# Patient Record
Sex: Male | Born: 1955 | Race: Black or African American | Hispanic: No | Marital: Married | Smoking: Former smoker
Health system: Southern US, Community
[De-identification: ages and names within clinical notes are randomized; demographics above are authoritative.]

## PROBLEM LIST (undated history)

## (undated) DIAGNOSIS — M542 Cervicalgia: Secondary | ICD-10-CM

## (undated) DIAGNOSIS — R9431 Abnormal electrocardiogram [ECG] [EKG]: Secondary | ICD-10-CM

## (undated) DIAGNOSIS — E119 Type 2 diabetes mellitus without complications: Secondary | ICD-10-CM

## (undated) DIAGNOSIS — G5601 Carpal tunnel syndrome, right upper limb: Secondary | ICD-10-CM

## (undated) DIAGNOSIS — R55 Syncope and collapse: Secondary | ICD-10-CM

## (undated) HISTORY — PX: CARPAL TUNNEL RELEASE: SHX101

---

## 1998-05-14 ENCOUNTER — Emergency Department (HOSPITAL_COMMUNITY): Admission: EM | Admit: 1998-05-14 | Discharge: 1998-05-14 | Payer: Self-pay | Admitting: Emergency Medicine

## 2006-07-26 ENCOUNTER — Inpatient Hospital Stay (HOSPITAL_COMMUNITY): Admission: EM | Admit: 2006-07-26 | Discharge: 2006-07-28 | Payer: Self-pay | Admitting: Emergency Medicine

## 2006-07-26 ENCOUNTER — Ambulatory Visit: Payer: Self-pay | Admitting: Cardiology

## 2006-07-27 ENCOUNTER — Encounter (INDEPENDENT_AMBULATORY_CARE_PROVIDER_SITE_OTHER): Payer: Self-pay | Admitting: *Deleted

## 2008-06-20 ENCOUNTER — Ambulatory Visit: Payer: Self-pay | Admitting: Internal Medicine

## 2008-07-04 ENCOUNTER — Encounter: Payer: Self-pay | Admitting: Internal Medicine

## 2008-07-04 ENCOUNTER — Ambulatory Visit: Payer: Self-pay | Admitting: Internal Medicine

## 2008-07-07 ENCOUNTER — Encounter: Payer: Self-pay | Admitting: Internal Medicine

## 2008-08-05 ENCOUNTER — Emergency Department (HOSPITAL_COMMUNITY): Admission: EM | Admit: 2008-08-05 | Discharge: 2008-08-05 | Payer: Self-pay | Admitting: Emergency Medicine

## 2010-08-19 LAB — DIFFERENTIAL
Basophils Absolute: 0.1 10*3/uL (ref 0.0–0.1)
Eosinophils Absolute: 0.3 10*3/uL (ref 0.0–0.7)
Eosinophils Relative: 5 % (ref 0–5)
Lymphs Abs: 1.7 10*3/uL (ref 0.7–4.0)
Neutro Abs: 2.7 10*3/uL (ref 1.7–7.7)

## 2010-08-19 LAB — CBC
HCT: 43 % (ref 39.0–52.0)
MCHC: 33.4 g/dL (ref 30.0–36.0)
MCV: 88.4 fL (ref 78.0–100.0)
RDW: 13.2 % (ref 11.5–15.5)

## 2010-08-19 LAB — BASIC METABOLIC PANEL
BUN: 5 mg/dL — ABNORMAL LOW (ref 6–23)
CO2: 26 mEq/L (ref 19–32)
Creatinine, Ser: 1.07 mg/dL (ref 0.4–1.5)
Glucose, Bld: 126 mg/dL — ABNORMAL HIGH (ref 70–99)

## 2010-09-24 NOTE — H&P (Signed)
NAME:  Joseph Todd, Joseph [name]:  [id number]   MEDICAL RECORD NO.:  [id number]          PATIENT TYPE:  EMS   LOCATION:  ED                           FACILITY:  [facility]   PHYSICIAN:  Kari Baars, M.D.  DATE OF BIRTH:  [birth year]   DATE OF ADMISSION:  07/26/2006  DATE OF DISCHARGE:                              HISTORY & PHYSICAL   PRIMARY CARE PHYSICIAN:  Gaspar Garbe, M.D.   CHIEF COMPLAINT:  Passed out.   HISTORY OF PRESENT ILLNESS:  Mr. Joseph Todd is a very pleasant 55 year old  African American male who has been remarkably healthy but presented to  the emergency department this evening with a complaint of two episodes  of syncope today.  He states that he was in his usual state of health  until this morning when he woke with a dry cough and sore throat.  Around 8:00 a.m. following a coughing paroxysm he  passed out.  This was  unwitnessed and he fell and hit the back of his head.  He awoke within 1  minute and returned to his normal state of health and went about his  normal business throughout the day.  At around 3:00 p.m. he had a  similar episode following another coughing fit, prompting him to come to  the emergency department.  While in the emergency department the patient  had a witnessed syncopal event that occurred when he was sitting up to  be placed on the cardiac monitor.  The nurse who witnessed the event  states that he was unconscious for less than 1 minute.  When he awoke he  was completely oriented.  There was no bladder or bowel incontinence and  he had no post episode confusion.  He was diaphoretic during the  episode.  The patient denies any prior history of syncope or seizure.  He has no prior cardiac history.  Denies any palpitations.  He has had a  significant decrease in the p.o. intake and states that he has not eaten  anything today.   In the emergency department he is found to have a temperature of 102.4,  heart rate initially 130  now 104.  Chest x-ray and head CT showed no  acute abnormalities.   REVIEW OF SYSTEMS:  All systems were reviewed with the patient and are  negative except as in HPI with following exceptions:  He does have some  myalgias.  Denies neck stiffness or headache except for where he hit the  back of his head.   PAST MEDICAL HISTORY:  No significant medical history or prior  surgeries.   MEDICATIONS:  None.   ALLERGIES:  NO KNOWN DRUG ALLERGIES.   SOCIAL HISTORY:  He is married with one son.  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  He quit smoking about 25 years ago.  He  drinks alcohol socially but has not had any recently and denies any  illicit drug use.   FAMILY HISTORY:  His mom may have had some heart disease later in life  but was otherwise healthy.  Father  was an alcoholic and may have also  had late onset heart disease.  His states he has a brother who has had  episodes of syncope recently as well.  No formal cause identified.   PHYSICAL EXAMINATION:  VITAL SIGNS:  Physical exam temperature 102.4,  blood pressure 136/94, pulse 130, respirations 18, oxygen saturation  100% on room air.  Orthostatic blood pressures obtained after fluid  hydration were 113/70 with a pulse of 106 lying, 120/80 with a pulse of  116 sitting, and 120/73 with pulse of 113 standing.  GENERAL:  Pleasant African American male in no acute distress.  HEENT: Oropharynx is moist.  Neck is supple.  Oropharynx moist.  No  tonsillar exudate.  TM's are clear bilaterally.  NECK:  Supple without lymphadenopathy, JVD or carotid bruits.  HEART:  Tachycardiac without murmurs, rubs or gallops.  LUNGS: Clear to auscultation bilaterally.  ABDOMEN:  Soft, nondistended, nontender with normoactive bowel sounds.  EXTREMITIES:  No clubbing, cyanosis or edema.  NEUROLOGIC:  Alert and oriented x4 with a nonfocal exam.   LABORATORY DATA:  CBC shows a white count of 7.3, hemoglobin 14.7,  platelets 425,000.   BMET significant for sodium 138, potassium 4.2,  chloride 102, bicarb 28, BUN 7, creatinine 1.4, glucose 127.  Cardiac  enzymes negative with a troponin of less than 0.05.  Influenza swab  negative.  Urinalysis negative.  Blood cultures were obtained and are  pending.   CLINICAL DATA:  1. EKG shows sinus tachycardia with nonspecific ST changes.  2. Chest x-ray shows no acute cardiopulmonary disease.  3. Head CT shows no acute intracranial abnormality.   ASSESSMENT/PLAN:  1. Recurrent syncopal episodes - these are likely vasovagal versus      orthostasis in the setting of coughing paroxysms and volume      depletion in the setting of his febrile illness.  Will admit to      telemetry to monitor for dysrhythmias.  Will rule out MI with      serial cardiac enzymes.  Will hydrate and monitor his status.      Consider an echocardiogram although he does not have any cardiac      murmur or preceding cardiac symptoms to suggest an underlying      cardiac cause.  No focal neurologic deficits to suggest a stroke.  2. Acute febrile illness - this is likely due to viral upper      respiratory infection versus acute bronchitis.  Will treat      empirically with Augmentin 875 mg b.i.d. and Mucinex DM while      awaiting blood culture data.  Monitor fever curve.  Tylenol p.r.n.  3. Disposition - anticipate discharge in 1-2 days if he is stable,      without need for further evaluation.      Kari Baars, M.D.  Electronically Signed     WS/MEDQ  D:  07/26/2006  T:  07/27/2006  Job:  [id number]   cc:   Gaspar Garbe, M.D.  Fax: [phone]

## 2010-09-24 NOTE — Discharge Summary (Signed)
NAMEVAUGHAN, Joseph [name]:  [id number]   MEDICAL RECORD NO.:  [id number]          PATIENT TYPE:  INP   LOCATION:  1409                         FACILITY:  [facility]   PHYSICIAN:  Gaspar Garbe, M.D.DATE OF BIRTH:  May 09, 1956   DATE OF ADMISSION:  07/26/2006  DATE OF DISCHARGE:  07/28/2006                               DISCHARGE SUMMARY   DISCHARGE DIAGNOSES:  1. Vasovagal syncope.  2. Possible hyperthyroidism, workup currently pending.  3. Upper respiratory infection/bronchitis.   DISCHARGE MEDICATIONS:  1. Augmentin 875 mg p.o. b.i.d. to complete a further 9 days.  2. Tessalon 200 mg p.o. t.i.d. p.r.n. cough.  3. Over-the-counter Coricidin or Claritin.  4. Ibuprofen and Tylenol as needed.  5. Atenolol 25 mg p.o. daily.   LABORATORY DATA AND X-RAY FINDINGS:  Sodium 140, potassium 4.4, BUN and  creatinine 8 and 1.3 respectively, glucose normal at 100.  White count  4.5, hemoglobin 15.5, hematocrit 39.6, platelets 338.  Cardiac enzymes  show elevated CK of 359, troponin 0.01 and MB fraction 0.6 indicating  cardiac issues.  TSH decreased at 0.19.  Labs currently pending are FT4,  T3 and antithyroid antibodies.  The patient had influenza for AFB stain  and was negative.  Urinalysis negative.  Blood cultures x2 negative.   Echocardiogram shows ejection fraction of 55-60% with no acute  abnormalities.   DISCHARGE PHYSICAL EXAMINATION:  VITAL SIGNS:  Blood pressure 131/82,  heart rate slightly increased at 101, respirations 18, temperature  ranging between 99 and 101, 98% on room air.  GENERAL:  In no acute distress.  HEENT:  Normocephalic, atraumatic.  PERRLA.  EOMI.  NECK:  No lymphadenopathy, JVD or bruit.  No thyromegaly.  No nodules  are appreciated.  LUNGS:  Coarse breath sounds with bronchitic changes.  No crackles or  wheezes at bases.  Slight tachycardia.  No murmurs, rubs or gallops  appreciated.  ABDOMEN:  Soft, nontender, normoactive bowel  sounds.  EXTREMITIES:  No clubbing, cyanosis or edema.   HOSPITAL COURSE:  The patient was admitted on July 26, 2006, after  having syncopal episodes at home and had been suffering an upper  respiratory infection for the last 2 days and actually lost  consciousness x2 after coughing.  He had a further one in the emergency  room, but was not on telemetry at the time.  It was observed by the ER  staff.  The patient was admitted.  EKG and cardiac enzymes were  undertaken as well as an echocardiogram and thyroid testing.  The  echocardiogram was normal as was the EKG showing tachycardia.  A  cardiology consult was undertaken with their diagnosis of vasovagal  syncope and no acute cardiac difficulties.  Recommendation was for  either beta-blocker or SSRI.  Later that same day, his thyroid test came  back abnormal with a TSH of 0.179 which is suppressed.  FT4 and T3,  antithyroid antibody were added on, but results are not available at the  time of this dictation.  The patient is to be initiated on the atenolol  25 mg per day.  He did have fevers overnight, but was relatively  comfortable.  He was able to take fluids on his own and able to take  antibiotics without any problems, therefore being discharge on July 28, 2006.  I had a long discussion with the patient and his wife regarding  thyroid problems and not to use over-the-counter stimulants and that  Coricidin or Claritin would be okay as well as ibuprofen and plenty of  fluids while his workup is pending.  The laboratory tests may take until  next week.  The patient and his wife were okay with this.  They also  know that the atenolol may help control his heart rate if, in fact, he  is hypothyroid.  They are to contact the office if they have any further  difficulties and indicate that his fever should break in the next day as  is the trend with people with similar illnesses in the community over  the past several weeks.  The patient  and his family will contact the  office if they have any further difficulties.   CONDITION ON DISCHARGE:  Stable.      Gaspar Garbe, M.D.  Electronically Signed     RWT/MEDQ  D:  07/28/2006  T:  07/28/2006  Job:  [id number]   cc:   Jonelle Sidle, MD  [address]  [place], [place] [address]

## 2010-09-24 NOTE — Consult Note (Signed)
NAMEMALIKHI, OGAN NO.:  [id number]   MEDICAL RECORD NO.:  [id number]          PATIENT TYPE:  INP   LOCATION:  1409                         FACILITY:  [facility]   PHYSICIAN:  Jonelle Sidle, MD DATE OF BIRTH:  1955/05/17   DATE OF CONSULTATION:  07/27/2006  DATE OF DISCHARGE:                                 CONSULTATION   REQUESTING PHYSICIAN:  Gaspar Garbe, M.D.   REASON FOR CONSULTATION:  Syncope.   HISTORY OF PRESENT ILLNESS:  Mr. Roser is a 55 year old male with no  reported major medical conditions who is now admitted to the hospital  with apparent bronchitis associated with paroxysms of cough, dry sore  throat, and fevers.  He has experienced recent episodes of syncope  associated with paroxysms of cough, the first of which was unwitnessed  and apparently caused him to fall back and hit his head.  The second  episode occurred reportedly in the emergency department while he was  seated prior to being placed on a cardiac monitor.  He was apparently  unconscious for less than a minute and awoke oriented without any  preceding or following symptoms of chest pain, palpitations, or dyspnea.  He was noted to be diaphoretic but did not have any seizure-like  activities.  He has had a significant decrease in his oral intake  recently as well, and was noted to have a temperature of 102.4 degrees  with heart rates initially up to 130, settling in just over 100 beats  per minute.  His electrocardiogram from March 19 showed sinus rhythm  with borderline increased voltage and nonspecific ST-T wave changes.  Repeat tracing today shows similar changes except for more prominent T-  wave inversions in the inferior leads.  His cardiac markers have been  normal with a troponin I level of 0.01, CK-MB of 1.1, and total CK of  385.  Chest x-ray reported some streaky atelectasis without acute  infiltrate or effusions.  He underwent an echocardiogram earlier today  reporting a left ventricular ejection fraction of 55-60% with no  regional wall motion abnormalities and evidence of diastolic  dysfunction.  Review of telemetry so far indicates no dysrhythmias.   ALLERGIES:  No known drug allergies.   CHRONIC MEDICATIONS:  None.  In the hospital he has been treated with:  1. Lovenox 40 mg subcu daily.  2. Aspirin 325 mg p.o. daily.  3. Mucinex DM one p.o. b.i.d.  4. Tylenol 650 mg p.o. q.6h. p.r.n.  5. In addition, he is taking Augmentin 875 mg p.o. b.i.d.  6. Tessalon Perles 200 mg p.o. p.r.n.   PAST MEDICAL HISTORY:  Is as outlined above.  The patient denies any  prior surgeries or cardiac problems.   SOCIAL HISTORY:  The patient is married.  He has one son.  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  He has a prior history of tobacco use  but quit 25 years ago.  He drinks alcohol socially.  Denies any illicit  substance use.   FAMILY HISTORY:  Significant for heart disease in the patient's mother  late in life.  Father with history of alcoholism.  Reports of brother  with history of syncope.  No clear history of sudden cardiac death or  premature cardiovascular disease.   REVIEW OF SYSTEMS:  As described in the history of present illness.  Has  had some headache and myalgias as well.  He also tells me that he has  had fainting episodes in the past with blood draws since childhood age.   EXAMINATION:  VITAL SIGNS:  Temperature is 99.8 degrees, heart rate 104  in sinus rhythm, respirations 16, blood pressure is 120/74.  GENERAL:  The patient is comfortable in no acute distress, still with  cough and sounding congested.  HEENT:  Conjunctivae and lids normal, oropharynx clear.  NECK:  Supple without elevated jugular venous pressure or loud bruits.  No thyromegaly is noted.  LUNGS:  Exhibit somewhat rhonchorous, coarse breath sounds.  No rales or  wheezing.  CARDIAC:  Reveals a regular rate and rhythm.  No S3 or S4 gallop.  No  loud  murmur or pericardial rub.  ABDOMEN:  Soft, nontender, normal active bowel sounds.  EXTREMITIES:  Exhibit no significant pitting edema.  Distal pulses are  2+.  SKIN:  Warm and dry.  MUSCULOSKELETAL:  No kyphosis is noted.  NEUROPSYCHIATRIC:  The patient is alert and oriented x3.  Affect is  normal.   LABORATORY DATA:  Wbc's 5.5, hemoglobin 13.6, hematocrit 39.6, platelets  372.  Sodium 140, potassium 4.2, chloride 107, bicarb 28, glucose 112,  BUN 6, creatinine 1.6.  TSH 0.179.   IMPRESSION:  1. Recent episodes of post tussive syncope, likely due to a      neurocardiogenic mechanism, particularly with a patient-reported      history of previous syncope following blood draws since childhood      age.  No major cardiac structural abnormalities identified by      echocardiogram today beyond diastolic dysfunction.  His      electrocardiogram shows nonspecific changes with some inferior T-      wave inversions which are somewhat more prominent based on serial      tracings, although associated with negative cardiac markers and no      recent chest pain.  His telemetry monitoring is unremarkable at      this point.  At this point ischemia or pulmonary embolus seem less      likely an etiology.  2. Apparent upper respiratory tract infection associated with fever,      myalgias, cough, and chest congestion.  No active infiltrates on      chest x-ray.  He is being treated with Augmentin.  3. Low TSH, no history of thyroid disease based on available      information.   RECOMMENDATIONS:  At this point agree with gentle hydration as  intravascular volume depletion can certainly exacerbate neurocardiogenic  syncope, and in this case post tussive syncope.  Would continue to  follow telemetry while hospitalized and follow up full set of cardiac  markers.  Mr. Trueheart has not reported any major episodes of syncope for some time and it is not clear that any additional electrophysiology   evaluation of this is necessary at this point.  Therapy with beta  blockers or sometimes selective serotonin reuptake inhibitors can be  used in patients with recurrent neurocardiogenic syncope, although in  this case it seems that management of his upper respiratory tract  infection and suppression of his cough might be the first choice.  Certainly, if he has recurrent problems then we could have him evaluated  by our electrophysiology team.  Will follow with you.      Jonelle Sidle, MD  Electronically Signed     SGM/MEDQ  D:  07/27/2006  T:  07/27/2006  Job:  [id number]

## 2013-06-21 ENCOUNTER — Encounter: Payer: Self-pay | Admitting: Internal Medicine

## 2013-11-21 ENCOUNTER — Encounter: Payer: Self-pay | Admitting: Internal Medicine

## 2014-11-19 ENCOUNTER — Encounter: Payer: Self-pay | Admitting: Internal Medicine

## 2019-06-09 ENCOUNTER — Other Ambulatory Visit: Payer: Self-pay

## 2019-06-09 ENCOUNTER — Observation Stay (HOSPITAL_COMMUNITY)
Admission: EM | Admit: 2019-06-09 | Discharge: 2019-06-10 | Disposition: A | Discharge status: Home / Self Care | Payer: Non-veteran care | Attending: Student in an Organized Health Care Education/Training Program | Admitting: Student in an Organized Health Care Education/Training Program

## 2019-06-09 ENCOUNTER — Emergency Department (HOSPITAL_COMMUNITY): Payer: Non-veteran care

## 2019-06-09 ENCOUNTER — Encounter (HOSPITAL_COMMUNITY): Payer: Self-pay

## 2019-06-09 ENCOUNTER — Observation Stay (HOSPITAL_BASED_OUTPATIENT_CLINIC_OR_DEPARTMENT_OTHER): Payer: Non-veteran care

## 2019-06-09 DIAGNOSIS — Z20822 Contact with and (suspected) exposure to covid-19: Secondary | ICD-10-CM | POA: Insufficient documentation

## 2019-06-09 DIAGNOSIS — R55 Syncope and collapse: Principal | ICD-10-CM | POA: Insufficient documentation

## 2019-06-09 DIAGNOSIS — M50322 Other cervical disc degeneration at C5-C6 level: Secondary | ICD-10-CM | POA: Insufficient documentation

## 2019-06-09 DIAGNOSIS — J3489 Other specified disorders of nose and nasal sinuses: Secondary | ICD-10-CM | POA: Diagnosis not present

## 2019-06-09 DIAGNOSIS — Z7984 Long term (current) use of oral hypoglycemic drugs: Secondary | ICD-10-CM | POA: Diagnosis not present

## 2019-06-09 DIAGNOSIS — I319 Disease of pericardium, unspecified: Secondary | ICD-10-CM | POA: Diagnosis not present

## 2019-06-09 DIAGNOSIS — E119 Type 2 diabetes mellitus without complications: Secondary | ICD-10-CM | POA: Diagnosis not present

## 2019-06-09 DIAGNOSIS — E118 Type 2 diabetes mellitus with unspecified complications: Secondary | ICD-10-CM

## 2019-06-09 DIAGNOSIS — R9431 Abnormal electrocardiogram [ECG] [EKG]: Secondary | ICD-10-CM

## 2019-06-09 DIAGNOSIS — N179 Acute kidney failure, unspecified: Secondary | ICD-10-CM | POA: Diagnosis not present

## 2019-06-09 DIAGNOSIS — E871 Hypo-osmolality and hyponatremia: Secondary | ICD-10-CM | POA: Diagnosis not present

## 2019-06-09 DIAGNOSIS — R0989 Other specified symptoms and signs involving the circulatory and respiratory systems: Secondary | ICD-10-CM | POA: Diagnosis not present

## 2019-06-09 DIAGNOSIS — G5601 Carpal tunnel syndrome, right upper limb: Secondary | ICD-10-CM

## 2019-06-09 DIAGNOSIS — R000 Tachycardia, unspecified: Secondary | ICD-10-CM | POA: Diagnosis not present

## 2019-06-09 DIAGNOSIS — M542 Cervicalgia: Secondary | ICD-10-CM | POA: Diagnosis not present

## 2019-06-09 LAB — CBG MONITORING, ED
Glucose-Capillary: 239 mg/dL — ABNORMAL HIGH (ref 70–99)
Glucose-Capillary: 234 mg/dL — ABNORMAL HIGH (ref 70–99)
Glucose-Capillary: 220 mg/dL — ABNORMAL HIGH (ref 70–99)
Glucose-Capillary: 169 mg/dL — ABNORMAL HIGH (ref 70–99)

## 2019-06-09 LAB — CBC
HCT: 51.2 % (ref 39.0–52.0)
Hemoglobin: 16.5 g/dL (ref 13.0–17.0)
MCH: 29.3 pg (ref 26.0–34.0)
MCHC: 32.2 g/dL (ref 30.0–36.0)
MCV: 90.9 fL (ref 80.0–100.0)
Platelets: 211 10*3/uL (ref 150–400)
RBC: 5.63 MIL/uL (ref 4.22–5.81)
RDW: 12.5 % (ref 11.5–15.5)
WBC: 6.4 10*3/uL (ref 4.0–10.5)
nRBC: 0 % (ref 0.0–0.2)

## 2019-06-09 LAB — HEMOGLOBIN A1C
Hgb A1c MFr Bld: 11.9 % — ABNORMAL HIGH (ref 4.8–5.6)
Mean Plasma Glucose: 294.83 mg/dL

## 2019-06-09 LAB — RESPIRATORY PANEL BY RT PCR (FLU A&B, COVID)
Influenza A by PCR: NEGATIVE
Influenza B by PCR: NEGATIVE
SARS Coronavirus 2 by RT PCR: NEGATIVE

## 2019-06-09 LAB — HIV ANTIBODY (ROUTINE TESTING W REFLEX): HIV Screen 4th Generation wRfx: NONREACTIVE

## 2019-06-09 LAB — BASIC METABOLIC PANEL
Anion gap: 11 (ref 5–15)
BUN: 17 mg/dL (ref 8–23)
CO2: 25 mmol/L (ref 22–32)
Calcium: 8.5 mg/dL — ABNORMAL LOW (ref 8.9–10.3)
Chloride: 94 mmol/L — ABNORMAL LOW (ref 98–111)
Creatinine, Ser: 1.42 mg/dL — ABNORMAL HIGH (ref 0.61–1.24)
GFR calc Af Amer: >60 mL/min (ref >60)
GFR calc non Af Amer: 53 mL/min — ABNORMAL LOW (ref >60)
Glucose, Bld: 290 mg/dL — ABNORMAL HIGH (ref 70–99)
Potassium: 4.1 mmol/L (ref 3.5–5.1)
Sodium: 130 mmol/L — ABNORMAL LOW (ref 135–145)

## 2019-06-09 LAB — ECHOCARDIOGRAM COMPLETE
Height: 68 in
Weight: 3056 oz

## 2019-06-09 LAB — GLUCOSE, CAPILLARY: Glucose-Capillary: 206 mg/dL — ABNORMAL HIGH (ref 70–99)

## 2019-06-09 LAB — TROPONIN I (HIGH SENSITIVITY)
Troponin I (High Sensitivity): 6 ng/L (ref <18)
Troponin I (High Sensitivity): 4 ng/L (ref <18)

## 2019-06-09 LAB — SODIUM, URINE, RANDOM: Sodium, Ur: 27 mmol/L

## 2019-06-09 LAB — OSMOLALITY: Osmolality: 287 mOsm/kg (ref 275–295)

## 2019-06-09 MED ORDER — ACETAMINOPHEN 325 MG PO TABS
650.0000 mg | ORAL_TABLET | Freq: Four times a day (QID) | ORAL | Status: AC
  Administered 2019-06-09 – 2019-06-10 (×3): 650 mg via ORAL
  Filled 2019-06-09 (×3): qty 2

## 2019-06-09 MED ORDER — ACETAMINOPHEN 325 MG PO TABS: 650.0000 mg | ORAL_TABLET | Freq: Four times a day (QID) | ORAL | Status: DC | PRN

## 2019-06-09 MED ORDER — SENNOSIDES-DOCUSATE SODIUM 8.6-50 MG PO TABS: 1.0000 | ORAL_TABLET | Freq: Every evening | ORAL | Status: DC | PRN

## 2019-06-09 MED ORDER — SODIUM CHLORIDE 0.9% FLUSH: 3.0000 mL | Freq: Once | INTRAVENOUS | Status: DC

## 2019-06-09 MED ORDER — ACETAMINOPHEN 650 MG RE SUPP: 650.0000 mg | Freq: Four times a day (QID) | RECTAL | Status: DC | PRN

## 2019-06-09 MED ORDER — INSULIN ASPART 100 UNIT/ML ~~LOC~~ SOLN
0.0000 [IU] | Freq: Three times a day (TID) | SUBCUTANEOUS | Status: DC
  Administered 2019-06-09 (×2): 5 [IU] via SUBCUTANEOUS
  Administered 2019-06-09 – 2019-06-10 (×2): 3 [IU] via SUBCUTANEOUS

## 2019-06-09 MED ORDER — PROMETHAZINE HCL 25 MG PO TABS: 12.5000 mg | ORAL_TABLET | Freq: Four times a day (QID) | ORAL | Status: DC | PRN

## 2019-06-09 MED ORDER — ENOXAPARIN SODIUM 40 MG/0.4ML ~~LOC~~ SOLN
40.0000 mg | Freq: Every day | SUBCUTANEOUS | Status: DC
  Filled 2019-06-09: qty 0.4

## 2019-06-09 NOTE — ED Notes (Signed)
Pt agrees to stay

## 2019-06-09 NOTE — ED Notes (Signed)
Internal Med at bedside.

## 2019-06-09 NOTE — H&P (Signed)
Date: 06/09/2019               Patient Name:  Joseph Todd MRN: [id number]  DOB: 1955-12-10 Age / Sex: 65 y.o., male   PCP: Patient, No Pcp Per         Medical Service: Internal Medicine Teaching Service         Attending Physician: Dr. Oswaldo Done, Marquita Palms, *    First Contact: Dr. Huel Cote Pager: [phone]  Second Contact: Dr. Delma Officer Pager: [phone]       After Hours (After 5p/  First Contact Pager: [phone]  weekends / holidays): Second Contact Pager: [phone]   Chief Complaint: Syncope   History of Present Illness:   Mr. Joseph Todd is a 64 y/o male with a PMHx of T2DM, right arm carpal tunnel who presents to the ED with c/o syncope.   Mr. Joseph Todd states that 06/08/19, he woke up feeling at his baseline. He was using the restroom and having a BM, when he suddenly developed sweating, felt that the room was spinning around him, and next was found down on the floor. His wife was home and heard him fall, so he does not believe he was down for long. Mr. Joseph Todd reports he had a prior episode like this approximately 4 months ago and was told that it was vasovagal. Afterwards, he laid down in bed to relax but continued to have intermittent dizzy episodes, and this is what prompted him to seek emergency medical care. He denies any fever, chills, SOB, dyspnea, CP, N/V, abdominal pain, headache. He denies any recent sick contacts.  Since yesterday's syncopal episode, he has been having pain at the base of his neck with radiation to both his arms. He also states that after he came to, he could not move his arms for 20 minutes but could move his legs without difficulty. He notes a hx of neck pain with some temperature sensitivity and right arm pain with future EMG planned at the [place]. Yesterday, he was unloading roof shingles and so he felt pretty sore in the AM but endorses drinking plenty of fluids.   Mr. Joseph Todd also notes polyuria (gets up 4 times nightly) but denies any hematuria or dysuria.  He has been tolerating PO intake without difficulty. He reports a doctor 1 year ago mentioned he had kidney disease but the doctor retired and no one else has mentioned it.   ED Course:  Patient presented to [facility] long after episode of syncope.  Initial lab work showed elevated creatinine of 1.42, mild hyponatremia at 130, negative CBC, negative troponins,  Meds:  Current Meds  Medication Sig  . metFORMIN (GLUCOPHAGE) 500 MG tablet Take 500 mg by mouth daily with breakfast.  . thiamine 100 MG tablet Take 100 mg by mouth 2 (two) times daily.  . Vitamin D, Ergocalciferol, (DRISDOL) 1.25 MG (50000 UNIT) CAPS capsule Take 50,000 Units by mouth once a week.   Allergies: Allergies as of 06/09/2019  . (No Known Allergies)   Past Medical History: - T2DM  - Carpal Tunnel (R) - Neck pain   Family History:  Diabetes- two brothers  htn-father  Heart disease-father   Social History:  Lives at home with wife  Denies smoking, drug use Drinks beer few times weekly. 3 beers in one setting  Review of Systems: A complete ROS was negative except as per HPI.   Physical Exam: Blood pressure 106/60, pulse (!) 103, temperature 98.1 F (36.7 C), temperature source Oral, resp.  rate (!) 30, height 5\' 8"  (1.727 m), weight 86.6 kg, SpO2 94 %.  Physical Exam Vitals and nursing note reviewed.  Constitutional:      General: He is not in acute distress.    Appearance: He is normal weight.  Cardiovascular:     Rate and Rhythm: Regular rhythm. Tachycardia present.     Heart sounds: No murmur.  Pulmonary:     Effort: Pulmonary effort is normal. No respiratory distress.     Breath sounds: Rales (bibasilar) present. No wheezing.  Abdominal:     General: Bowel sounds are normal. There is no distension.     Palpations: Abdomen is soft.     Tenderness: There is no abdominal tenderness.  Musculoskeletal:        General: No tenderness or deformity.     Right lower leg: No edema.     Left lower leg: No  edema.  Skin:    General: Skin is warm and dry.  Neurological:     General: No focal deficit present.     Mental Status: He is alert and oriented to person, place, and time. Mental status is at baseline.  Psychiatric:        Mood and Affect: Mood normal.        Behavior: Behavior normal.    Assessment & Plan by Problem: Active Problems:   Syncope  # Syncope  Likely a vasovagal syncopal episode at this time. Episode occurred after a BM with prodromal dizziness and diaphoresis. CT head without acute intracranial process. TTE was obtained and did not show any structural abnormalities. Initial glucose in ED was 239. Orthostatics were negative.   - Cardiology following, we appreciate their recommendations  - Telemetry   # Pericarditis  EKG changes on admission consistent with pericarditis, including diffuse ST elevation and PR depression. No recent sick contacts or URI symptoms consistent with viral infection. TTE was obtained and shows no evidence of pericardial effusion. Fortunately, patient remains asymptomatic, as with his AKI, we would not be able to treat with usual NSAIDs.   - Cardiology following, we appreciate their recommendations  - Telemetry  # Neck pain  Syncopal episode followed by acute pain in neck with radiation to bilateral arms. CT cervical spine without any acute cervical spine fracture, has degenerative disc disease. No acute neurological findings. Will continue to monitor.   - Tylenol 650 mg q6h for 3 doses, then PRN  # Acute Kidney Injury  # Hyponatremia  Per patient, no recent history of decreased oral intake and appears euvolemic on examination. He is tachycardic, which may represent mild dehydration.   - Urinalysis pending  - Serum osmolality - Urine sodium - BMP in the AM  # Type 2 Diabetes Mellitus  A1c today is 11.9%, demonstrating fairly uncontrolled diabetes.  His only home medication is Metformin and likely patient will need additional medications,  but will recommend outpatient follow-up.  - SSI  Code: FULL Diet: Carb modified DVT ppx: Lovenox   Dispo: Admit patient to Observation with expected length of stay less than 2 midnights.  Signed: Dr. Jose Persia Internal Medicine PGY-1  Pager: [phone] 06/09/2019, 4:50 PM

## 2019-06-09 NOTE — ED Notes (Signed)
[facility] here for transport to [facility] ED.

## 2019-06-09 NOTE — ED Notes (Signed)
[phone] daughter  Please call for an update

## 2019-06-09 NOTE — ED Provider Notes (Signed)
[facility] DEPT Provider Note   CSN: [id number] Arrival date & time: 06/09/19  0404     History Chief Complaint  Patient presents with  . Loss of Consciousness    Joseph Todd is a 64 y.o. male.  HPI     64 year old male with history of diabetes comes in with chief complaint of fainting.  Patient has history of diabetes.  He reports that he was defecating when suddenly he started feeling hot, sweaty and the next thing he knows he was waking up on the floor of his bathroom.  Once patient woke up he noticed that he was having pain at the base of his neck, shoulders and his arms.  He also was feeling dizzy, and felt it was best to come to the ER.  When patient arrived he was noted to be hypotensive.  He complaint of nausea and dizziness.  Patient is also having some tingling sensation in his hands.  He denies any chest pain, shortness of breath.  There is no known history of CAD and he denies any recent exertional chest discomfort.  History reviewed. No pertinent past medical history.  There are no problems to display for this patient.   History reviewed. No pertinent family history.  Social History   Tobacco Use  . Smoking status: Not on file  Substance Use Topics  . Alcohol use: Not on file  . Drug use: Not on file    Home Medications Prior to Admission medications   Not on File    Allergies    Patient has no known allergies.  Review of Systems   Review of Systems  Constitutional: Positive for activity change and diaphoresis.  Respiratory: Negative for shortness of breath.   Cardiovascular: Negative for chest pain.  Gastrointestinal: Positive for nausea.  Musculoskeletal: Positive for myalgias and neck pain.  Neurological: Positive for dizziness.  All other systems reviewed and are negative.   Physical Exam Updated Vital Signs BP (!) 144/89 (BP Location: Right Arm)   Pulse 98   Temp 98.6 F (37 C) (Oral)   Resp 12   Ht  5\' 8"  (1.727 m)   Wt 86.6 kg   SpO2 92%   BMI 29.04 kg/m   Physical Exam Vitals and nursing note reviewed.  Constitutional:      Appearance: He is well-developed.  HENT:     Head: Atraumatic.  Cardiovascular:     Rate and Rhythm: Normal rate.  Pulmonary:     Effort: Pulmonary effort is normal.  Musculoskeletal:     Cervical back: Neck supple.  Skin:    General: Skin is warm.  Neurological:     Mental Status: He is alert and oriented to person, place, and time.     ED Results / Procedures / Treatments   Labs (all labs ordered are listed, but only abnormal results are displayed) Labs Reviewed  CBG MONITORING, ED - Abnormal; Notable for the following components:      Result Value   Glucose-Capillary 239 (*)    All other components within normal limits  RESPIRATORY PANEL BY RT PCR (FLU A&B, COVID)  CBC  BASIC METABOLIC PANEL  URINALYSIS, ROUTINE W REFLEX MICROSCOPIC  TROPONIN I (HIGH SENSITIVITY)    EKG EKG Interpretation  Date/Time:  Sunday June 09 2019 04:32:52 EST Ventricular Rate:  90 PR Interval:    QRS Duration: 70 QT Interval:  332 QTC Calculation: 407 R Axis:   68 Text Interpretation: Sinus rhythm Abnormal R-wave progression,  early transition ST elevation, consider inferior injury subtle ST elevation in inferior and lateral leads Confirmed by Derwood Kaplan [phone]) on 06/09/2019 4:38:29 AM   EKG Interpretation  Date/Time:  "Sunday June 09 2019 04:51:00 EST Ventricular Rate:  103 PR Interval:    QRS Duration: 82 QT Interval:  319 QTC Calculation: 418 R Axis:   63 Text Interpretation: POSTERIOR EKG Sinus tachycardia Abnormal R-wave progression, early transition ST elevation suggests acute pericarditis Nonspecific ST and T wave abnormality Confirmed by Yaire Kreher ([id number]) on 06/09/2019 5:36:10 AM        Radiology No results found.  Procedures .Critical Care Performed by: Ignacia Gentzler, MD Authorized by: Brycen Bean, MD    Critical care provider statement:    Critical care time (minutes):  38   Critical care was necessary to treat or prevent imminent or life-threatening deterioration of the following conditions:  CNS failure or compromise   Critical care was time spent personally by me on the following activities:  Discussions with consultants, evaluation of patient's response to treatment, examination of patient, ordering and performing treatments and interventions, ordering and review of laboratory studies, ordering and review of radiographic studies, pulse oximetry, re-evaluation of patient's condition, obtaining history from patient or surrogate and review of old charts   (including critical care time)  Medications Ordered in ED Medications  sodium chloride flush (NS) 0.9 % injection 3 mL (has no administration in time range)    ED Course  I have reviewed the triage vital signs and the nursing notes.  Pertinent labs & imaging results that were available during my care of the patient were reviewed by me and considered in my medical decision making (see chart for details).    MDM Rules/Calculators/A&P                      63"  year old male comes in a chief complaint of syncope. He has history of diabetes.  Once he woke up on the floor he has been having pain at the base of his neck along with discomfort over the shoulder and both upper extremities.  Initial impression is that patient syncopal episode was likely a vasovagal event.  The pain in his neck and shoulder is likely related to trauma.  However, at arrival patient was noted to be hypotensive and he was complaining of nausea -EKG was obtained and it shows peak T waves in lead V2 along with ST elevations in inferior leads.  When EKG findings were taken into account with the clinical picture of syncope, nausea, hypotension -we considered posterior MI as the possibility for the syncope and resultant discomfort.  Repeat EKG is ordered and there  continues to be nonspecific ST changes.   I decided to speak with Dr. , STEMI doctor on call.  We went over the patient's presentation and he reviewed multiple EKGs with me.  For now Dr. Herbie Baltimore recommends ER to ER transfer to [facility] for cardiology evaluation.  Troponins have been sent.  Patient is comfortable.  His blood pressure is improved and nausea resolved.  I discussed the case with Dr. Redge Gainer who is excepting at [facility].  If cardiology team does not think that there is underlying cardiac issue leading to the neck discomfort, that patient might need evaluation for spinal cord injury.   Final Clinical Impression(s) / ED Diagnoses Final diagnoses:  Syncope and collapse  Abnormal ECG  Neck pain    Rx / DC Orders ED Discharge Orders  None       Derwood Kaplan, MD 06/09/19 [phone]

## 2019-06-09 NOTE — Progress Notes (Signed)
Echocardiogram 2D Echocardiogram has been performed.  Joseph Todd 06/09/2019, 10:55 AM

## 2019-06-09 NOTE — ED Notes (Signed)
Pt. Unable to complete orthostatic vitals. Pt. Stated feeling dizzy while sitting on the side of the bed. Attempted to stand pt. When pt. Stated he needed to remain sitting

## 2019-06-09 NOTE — ED Triage Notes (Signed)
Pt reports that he passed out after a bowel movement tonight. When he came to, he was experiencing pain in his shoulders bilaterally that radiated down to his hands. Hypotensive.A&Ox4.

## 2019-06-09 NOTE — ED Notes (Signed)
[facility] dispatch notified for need of transport to [facility] ED.

## 2019-06-09 NOTE — ED Notes (Signed)
Patient transported to CT 

## 2019-06-09 NOTE — ED Provider Notes (Signed)
Patient transferred from [facility] emergency department following a syncopal episode and she ECG concerning for possible occult STEMI.  Patient describes syncope associated with bowel movement with associated nausea and diaphoresis suggesting vagal syncope.  He also relates history of passing out while coughing and also being diagnosed with vagal syncope.  He denies chest pain, heaviness, tightness, pressure.  He is complaining of neck pain.  He is still in a stiff cervical collar and there is some tenderness in the neck posteriorly.  I discussed the case with Dr. Herbie Baltimore of cardiology service who is requesting the cardiology fellow come to evaluate the patient.  He is also requesting a repeat ECG.  Labs drawn at [facility] showed mild renal insufficiency, troponin pending.  Troponin is normal.  I have discussed the case with cardiology, who recommends hospitalist admission.  They are concerned that patient needs an echocardiogram to make sure he does not have pericarditis.  Case is discussed with Dr. Delma Officer  of Internal Medicine Teaching Service, who agrees to admit the patient.   EKG Interpretation  Date/Time:  Sunday June 09 2019 07:45:36 EST Ventricular Rate:  98 PR Interval:    QRS Duration: 79 QT Interval:  317 QTC Calculation: 405 R Axis:   82 Text Interpretation: Sinus rhythm Borderline right axis deviation ST elevation suggests acute pericarditis When compared with ECG of EARLIER SAME DATE No significant change was found Confirmed by Dione Booze ([id number]) on 06/09/2019 7:49:01 AM         Dione Booze, MD 06/09/19 [phone]

## 2019-06-09 NOTE — ED Notes (Signed)
ED Provider at bedside. 

## 2019-06-09 NOTE — Consult Note (Signed)
Cardiology Consultation:   Patient ID: Joseph Todd MRN: [id number]; DOB: 02/13/1956  Admit date: 06/09/2019 Date of Consult: 06/09/2019  Primary Care Provider: Patient, No Pcp Per Primary Cardiologist: No primary care provider on file.   Primary Electrophysiologist:  None     Patient Profile:   Joseph Todd is a 64 y.o. male with a hx of DM who is being seen today for the evaluation of abnormal ECG at the request of Dr. Rhunette Croft.  History of Present Illness:   Joseph Todd is a 64 y.o. male with a hx of DM who is being seen today for the evaluation of abnormal ECG at the request of Dr. Rhunette Croft.  The patient has no known cardiac history. He presented to the ED after a syncopal episode at home that occurred following a bowel movement. The patient reports that after defecating he felt hot and sweaty with a sensation that the room was spinning around him. He then lost consciousness and woke up on he floor. He had pain is his back and neck where he fell. He denied any chest pain or dyspnea or heart racing before of after the episode.  In the ED, SBP 110-140s. HR 90s-105. ECG was performed and showed sinus rhythm with ST elevation in I, II, AVF, V4-V6. Diffuse PR segment depression (and PR elevation in AVR) were noted. Repeat ECGs were largely unchanged. Posterior ECG was also performed (4:51:00) and did not look significantly different from prior studies. CT head and cervical spine were performed. Dr. Herbie Baltimore was called by Dr. Rhunette Croft to discuss the ECGs, and transfer to [facility] ED was recommended for further assessment.  On arrival to [facility], ECG was repeated and showed ST changes similar to (but less pronounced) than prior ECGs. On my assessment, the patient provides the above history and continues to deny any present or prior chest pain or other symptoms except for that in his back and neck. He states that he has had prior ECGs but no other cardiac testing. Cardiac labs remain pending, but  labs thus far are notable for Cr 1.42.    Medical History: DM  Home Medications:  Prior to Admission medications   Not on File    Inpatient Medications: Scheduled Meds: . sodium chloride flush  3 mL Intravenous Once   Continuous Infusions:  PRN Meds:   Allergies:   No Known Allergies  Social History:   Social History   Socioeconomic History  . Marital status: Married    Spouse name: Not on file  . Number of children: Not on file  . Years of education: Not on file  . Highest education level: Not on file  Occupational History  . Not on file  Tobacco Use  . Smoking status: Not on file  Substance and Sexual Activity  . Alcohol use: Not on file  . Drug use: Not on file  . Sexual activity: Not on file  Other Topics Concern  . Not on file  Social History Narrative  . Not on file   Social Determinants of Health   Financial Resource Strain:   . Difficulty of Paying Living Expenses: Not on file  Food Insecurity:   . Worried About Programme researcher, broadcasting/film/video in the Last Year: Not on file  . Ran Out of Food in the Last Year: Not on file  Transportation Needs:   . Lack of Transportation (Medical): Not on file  . Lack of Transportation (Non-Medical): Not on file  Physical Activity:   . Days  of Exercise per Week: Not on file  . Minutes of Exercise per Session: Not on file  Stress:   . Feeling of Stress : Not on file  Social Connections:   . Frequency of Communication with Friends and Family: Not on file  . Frequency of Social Gatherings with Friends and Family: Not on file  . Attends Religious Services: Not on file  . Active Member of Clubs or Organizations: Not on file  . Attends Archivist Meetings: Not on file  . Marital Status: Not on file  Intimate Partner Violence:   . Fear of Current or Ex-Partner: Not on file  . Emotionally Abused: Not on file  . Physically Abused: Not on file  . Sexually Abused: Not on file    Family History:   Denies any family  history of heart disease  ROS:  Please see the history of present illness.  All other ROS reviewed and negative.     Physical Exam/Data:   Vitals:   06/09/19 0525 06/09/19 0600 06/09/19 0601 06/09/19 0700  BP: (!) 144/89 137/87 137/87 120/78  Pulse: 98  (!) 101   Resp: 12 19 (!) 23 17  Temp: 98.6 F (37 C)  98.1 F (36.7 C)   TempSrc: Oral  Oral   SpO2: 92%  93%   Weight:      Height:       No intake or output data in the 24 hours ending 06/09/19 0724 Last 3 Weights 06/09/2019  Weight (lbs) 191 lb  Weight (kg) 86.637 kg     Body mass index is 29.04 kg/m.  General:  Well nourished, well developed, in no acute distress  HEENT: normal Neck: Wearing C-collar Endocrine:  No thryomegaly Cardiac:  normal S1, S2; RRR; no murmur  Lungs:  clear to auscultation bilaterally, no wheezing, rhonchi or rales  Abd: soft, nontender  Ext: no edema Musculoskeletal:  Normal muscle bulk Skin: warm and dry  Neuro: No focal abnormalities noted Psych:  Normal affect   EKG:  The EKG was personally reviewed and demonstrates:  Sinus rhythm with ST elevation in I, II, AVF, V4-V6. Diffuse PR segment depression (and PR elevation in AVR)  Telemetry:  Telemetry was personally reviewed and demonstrates:  Sinus tachycardia  Relevant CV Studies: None  Laboratory Data:  High Sensitivity Troponin:   Recent Labs  Lab 06/09/19 0459  TROPONINIHS 6     Chemistry Recent Labs  Lab 06/09/19 0459  NA 130*  K 4.1  CL 94*  CO2 25  GLUCOSE 290*  BUN 17  CREATININE 1.42*  CALCIUM 8.5*  GFRNONAA 53*  GFRAA >60  ANIONGAP 11    No results for input(s): PROT, ALBUMIN, AST, ALT, ALKPHOS, BILITOT in the last 168 hours. Hematology Recent Labs  Lab 06/09/19 0459  WBC 6.4  RBC 5.63  HGB 16.5  HCT 51.2  MCV 90.9  MCH 29.3  MCHC 32.2  RDW 12.5  PLT 211   BNPNo results for input(s): BNP, PROBNP in the last 168 hours.  DDimer No results for input(s): DDIMER in the last 168  hours.   Radiology/Studies:  CT Head Wo Contrast  Result Date: 06/09/2019 CLINICAL DATA:  Patient with recent syncopal episode. EXAM: CT HEAD WITHOUT CONTRAST CT CERVICAL SPINE WITHOUT CONTRAST TECHNIQUE: Multidetector CT imaging of the head and cervical spine was performed following the standard protocol without intravenous contrast. Multiplanar CT image reconstructions of the cervical spine were also generated. COMPARISON:  Brain CT 07/26/2006 FINDINGS: CT HEAD  FINDINGS Brain: Ventricles and sulci are appropriate for patient's age. No evidence for acute cortically based infarct, intracranial hemorrhage, mass lesion or mass-effect. Vascular: Unremarkable Skull: Intact. Sinuses/Orbits: Polypoid mucosal thickening involving the frontal sinus (image 13; series 4). Remainder the paranasal sinuses are well aerated. Mastoid air cells are unremarkable. Orbits are unremarkable. Other: None. CT CERVICAL SPINE FINDINGS Alignment: Reversal of the normal cervical lordosis. Skull base and vertebrae: Intact. Soft tissues and spinal canal: No prevertebral fluid or swelling. No visible canal hematoma. Disc levels: Degenerative disc disease most pronounced C5-6, C6-7 and C7-T1. No evidence for acute fracture. Upper chest: Unremarkable Other: None IMPRESSION: No acute intracranial process. Polypoid mucosal thickening involving the frontal sinus. No acute cervical spine fracture.  Degenerative disc disease. Electronically Signed   By: Annia Belt M.D.   On: 06/09/2019 05:50   CT Cervical Spine Wo Contrast  Result Date: 06/09/2019 CLINICAL DATA:  Patient with recent syncopal episode. EXAM: CT HEAD WITHOUT CONTRAST CT CERVICAL SPINE WITHOUT CONTRAST TECHNIQUE: Multidetector CT imaging of the head and cervical spine was performed following the standard protocol without intravenous contrast. Multiplanar CT image reconstructions of the cervical spine were also generated. COMPARISON:  Brain CT 07/26/2006 FINDINGS: CT HEAD  FINDINGS Brain: Ventricles and sulci are appropriate for patient's age. No evidence for acute cortically based infarct, intracranial hemorrhage, mass lesion or mass-effect. Vascular: Unremarkable Skull: Intact. Sinuses/Orbits: Polypoid mucosal thickening involving the frontal sinus (image 13; series 4). Remainder the paranasal sinuses are well aerated. Mastoid air cells are unremarkable. Orbits are unremarkable. Other: None. CT CERVICAL SPINE FINDINGS Alignment: Reversal of the normal cervical lordosis. Skull base and vertebrae: Intact. Soft tissues and spinal canal: No prevertebral fluid or swelling. No visible canal hematoma. Disc levels: Degenerative disc disease most pronounced C5-6, C6-7 and C7-T1. No evidence for acute fracture. Upper chest: Unremarkable Other: None IMPRESSION: No acute intracranial process. Polypoid mucosal thickening involving the frontal sinus. No acute cervical spine fracture.  Degenerative disc disease. Electronically Signed   By: Annia Belt M.D.   On: 06/09/2019 05:50         Assessment and Plan:   Abnormal ECG Suspected pericarditis The patient presented to the ED with syncope that sounds to be vasovagal in etiology. Workup in the ED was notable for diffuse ST elevations with some degree of PR depression. He has no chest pain, and troponin is pending. His presentation is not typical for ACS. Suspect his ECG changes to be due to pericarditis. However, the cause of pericarditis is not clear. He remains CP free. At this time, will plan for basic cardiac workup. -Continue to monitor on telemetry -Trend troponin -Echocardiogram ordered  Syncope As above, the patient's syncopal episode is suggestive of vasovagal etiology. He has had no chest pain or palpitations to suggest arrhythmia, and his ECG changes are likely to be due to pericarditis, as above. -Continue to monitor on telemetry -Trend troponin -Echocardiogram ordered      For questions or updates, please contact  [facility] Please consult [email or web address] for contact info under     Signed, Ernest Mallick, MD  06/09/2019 7:24 AM

## 2019-06-09 NOTE — ED Notes (Signed)
ED TO INPATIENT HANDOFF REPORT  ED Nurse Name and Phone #: [phone] Wendie SimmerMelanie F., RN  S Name/Age/Gender Joseph Todd 64 y.o. male Room/Bed: 006C/006C  Code Status   Code Status: Full Code  Home/SNF/Other Home Patient oriented to: self, place, time and situation Is this baseline? Yes   Triage Complete: Triage complete  Chief Complaint Syncope [R55]  Triage Note Pt reports that he passed out after a bowel movement tonight. When he came to, he was experiencing pain in his shoulders bilaterally that radiated down to his hands. Hypotensive.A&Ox4.     Allergies No Known Allergies  Level of Care/Admitting Diagnosis ED Disposition    ED Disposition Condition Comment   Admit  [facility] [[id number]]  Level of Care: Telemetry Medical [104]  Covid Evaluation: Confirmed COVID Negative  Date Laboratory Confirmed COVID Negative: 06/09/2019  Diagnosis: Syncope [206001]  Admitting Physician: Silvio PateHOFFMAN, ERIK C [[id number]]  Attending Physician: Gust RungHOFFMAN, ERIK C [[id number]]       B Medical/Surgery History History reviewed. No pertinent past medical history.    A IV Location/Drains/Wounds Patient Lines/Drains/Airways Status   Active Line/Drains/Airways    Name:   Placement date:   Placement time:   Site:   Days:   Peripheral IV 06/09/19 Left Antecubital   06/09/19    0458    Antecubital   less than 1   Peripheral IV 06/09/19 Left Hand   06/09/19    0453    Hand   less than 1          Intake/Output Last 24 hours No intake or output data in the 24 hours ending 06/09/19 2058  Labs/Imaging Results for orders placed or performed during the hospital encounter of 06/09/19 (from the past 48 hour(s))  CBG monitoring, ED     Status: Abnormal   Collection Time: 06/09/19  4:52 AM  Result Value Ref Range   Glucose-Capillary 239 (H) 70 - 99 mg/dL  Basic metabolic panel     Status: Abnormal   Collection Time: 06/09/19  4:59 AM  Result Value Ref Range   Sodium 130 (L)  135 - 145 mmol/L   Potassium 4.1 3.5 - 5.1 mmol/L   Chloride 94 (L) 98 - 111 mmol/L   CO2 25 22 - 32 mmol/L   Glucose, Bld 290 (H) 70 - 99 mg/dL   BUN 17 8 - 23 mg/dL   Creatinine, Ser 4.541.42 (H) 0.61 - 1.24 mg/dL   Calcium 8.5 (L) 8.9 - 10.3 mg/dL   GFR calc non Af Amer 53 (L) >60 mL/min   GFR calc Af Amer >60 >60 mL/min   Anion gap 11 5 - 15    Comment: Performed at [facility], [address] [place], [place] [address]  CBC     Status: None   Collection Time: 06/09/19  4:59 AM  Result Value Ref Range   WBC 6.4 4.0 - 10.5 K/uL   RBC 5.63 4.22 - 5.81 MIL/uL   Hemoglobin 16.5 13.0 - 17.0 g/dL   HCT 91.451.2 78.239.0 - 95.652.0 %   MCV 90.9 80.0 - 100.0 fL   MCH 29.3 26.0 - 34.0 pg   MCHC 32.2 30.0 - 36.0 g/dL   RDW 21.312.5 08.611.5 - 57.815.5 %   Platelets 211 150 - 400 K/uL   nRBC 0.0 0.0 - 0.2 %    Comment: Performed at [facility], [address] [place], [place] [address]  Troponin I (High Sensitivity)  Status: None   Collection Time: 06/09/19  4:59 AM  Result Value Ref Range   Troponin I (High Sensitivity) 6 <18 ng/L    Comment: (NOTE) Elevated high sensitivity troponin I (hsTnI) values and significant  changes across serial measurements may suggest ACS but many other  chronic and acute conditions are known to elevate hsTnI results.  Refer to the "Links" section for chest pain algorithms and additional  guidance. Performed at [facility], [address] [place], [place] [address]   Respiratory Panel by RT PCR (Flu A&B, Covid) - Nasopharyngeal Swab     Status: None   Collection Time: 06/09/19  5:37 AM   Specimen: Nasopharyngeal Swab  Result Value Ref Range   SARS Coronavirus 2 by RT PCR NEGATIVE NEGATIVE    Comment: (NOTE) SARS-CoV-2 target nucleic acids are NOT DETECTED. The SARS-CoV-2 RNA is generally detectable in upper respiratoy specimens during the acute phase of infection. The lowest concentration of SARS-CoV-2 viral  copies this assay can detect is 131 copies/mL. A negative result does not preclude SARS-Cov-2 infection and should not be used as the sole basis for treatment or other patient management decisions. A negative result may occur with  improper specimen collection/handling, submission of specimen other than nasopharyngeal swab, presence of viral mutation(s) within the areas targeted by this assay, and inadequate number of viral copies (<131 copies/mL). A negative result must be combined with clinical observations, patient history, and epidemiological information. The expected result is Negative. Fact Sheet for Patients:  https://www.moore.com/https://www.fda.gov/media/142436/download Fact Sheet for Healthcare Providers:  https://www.young.biz/https://www.fda.gov/media/142435/download This test is not yet ap proved or cleared by the [place] FDA and  has been authorized for detection and/or diagnosis of SARS-CoV-2 by FDA under an Emergency Use Authorization (EUA). This EUA will remain  in effect (meaning this test can be used) for the duration of the COVID-19 declaration under Section 564(b)(1) of the Act, 21 U.S.C. section 360bbb-3(b)(1), unless the authorization is terminated or revoked sooner.    Influenza A by PCR NEGATIVE NEGATIVE   Influenza B by PCR NEGATIVE NEGATIVE    Comment: (NOTE) The Xpert Xpress SARS-CoV-2/FLU/RSV assay is intended as an aid in  the diagnosis of influenza from Nasopharyngeal swab specimens and  should not be used as a sole basis for treatment. Nasal washings and  aspirates are unacceptable for Xpert Xpress SARS-CoV-2/FLU/RSV  testing. Fact Sheet for Patients: https://www.moore.com/https://www.fda.gov/media/142436/download Fact Sheet for Healthcare Providers: https://www.young.biz/https://www.fda.gov/media/142435/download This test is not yet approved or cleared by the [place] FDA and  has been authorized for detection and/or diagnosis of SARS-CoV-2 by  FDA under an Emergency Use Authorization (EUA). This EUA will remain  in  effect (meaning this test can be used) for the duration of the  Covid-19 declaration under Section 564(b)(1) of the Act, 21  U.S.C. section 360bbb-3(b)(1), unless the authorization is  terminated or revoked. Performed at [facility], [address] [place], [place] [address]   Sodium, urine, random     Status: None   Collection Time: 06/09/19  6:38 AM  Result Value Ref Range   Sodium, Ur 27 mmol/L    Comment: Performed at [facility], [address] [place], [place] [address]  Troponin I (High Sensitivity)     Status: None   Collection Time: 06/09/19  7:37 AM  Result Value Ref Range   Troponin I (High Sensitivity) 4 <18 ng/L    Comment: (NOTE) Elevated high sensitivity troponin I (hsTnI) values and significant  changes across serial measurements may suggest ACS but many other  chronic and acute conditions are known to elevate hsTnI results.  Refer to the "Links" section for chest pain algorithms and additional  guidance. Performed at [facility], [address], [place], [place] [address]   Hemoglobin A1c     Status: Abnormal   Collection Time: 06/09/19  8:34 AM  Result Value Ref Range   Hgb A1c MFr Bld 11.9 (H) 4.8 - 5.6 %    Comment: (NOTE) Pre diabetes:          5.7%-6.4% Diabetes:              >6.4% Glycemic control for   <7.0% adults with diabetes    Mean Plasma Glucose 294.83 mg/dL    Comment: Performed at [facility], [address] [place], [place] [address]  CBG monitoring, ED     Status: Abnormal   Collection Time: 06/09/19  8:37 AM  Result Value Ref Range   Glucose-Capillary 234 (H) 70 - 99 mg/dL   Comment 1 Notify RN    Comment 2 Document in Chart   HIV Antibody (routine testing w rflx)     Status: None   Collection Time: 06/09/19  8:54 AM  Result Value Ref Range   HIV Screen 4th Generation wRfx NON REACTIVE NON REACTIVE    Comment: Performed at [facility], [address] [place], [place] [address]  CBG  monitoring, ED     Status: Abnormal   Collection Time: 06/09/19 11:39 AM  Result Value Ref Range   Glucose-Capillary 220 (H) 70 - 99 mg/dL   Comment 1 Notify RN    Comment 2 Document in Chart   CBG monitoring, ED     Status: Abnormal   Collection Time: 06/09/19  5:00 PM  Result Value Ref Range   Glucose-Capillary 169 (H) 70 - 99 mg/dL   Comment 1 Notify RN    Comment 2 Document in Chart   Osmolality     Status: None   Collection Time: 06/09/19  6:04 PM  Result Value Ref Range   Osmolality 287 275 - 295 mOsm/kg    Comment: Performed at [facility], [address] [place], [place] [address]   CT Head Wo Contrast  Result Date: 06/09/2019 CLINICAL DATA:  Patient with recent syncopal episode. EXAM: CT HEAD WITHOUT CONTRAST CT CERVICAL SPINE WITHOUT CONTRAST TECHNIQUE: Multidetector CT imaging of the head and cervical spine was performed following the standard protocol without intravenous contrast. Multiplanar CT image reconstructions of the cervical spine were also generated. COMPARISON:  Brain CT 07/26/2006 FINDINGS: CT HEAD FINDINGS Brain: Ventricles and sulci are appropriate for patient's age. No evidence for acute cortically based infarct, intracranial hemorrhage, mass lesion or mass-effect. Vascular: Unremarkable Skull: Intact. Sinuses/Orbits: Polypoid mucosal thickening involving the frontal sinus (image 13; series 4). Remainder the paranasal sinuses are well aerated. Mastoid air cells are unremarkable. Orbits are unremarkable. Other: None. CT CERVICAL SPINE FINDINGS Alignment: Reversal of the normal cervical lordosis. Skull base and vertebrae: Intact. Soft tissues and spinal canal: No prevertebral fluid or swelling. No visible canal hematoma. Disc levels: Degenerative disc disease most pronounced C5-6, C6-7 and C7-T1. No evidence for acute fracture. Upper chest: Unremarkable Other: None IMPRESSION: No acute intracranial process. Polypoid mucosal thickening involving the frontal sinus.  No acute cervical spine fracture.  Degenerative disc disease. Electronically Signed   By: Annia Belt M.D.   On: 06/09/2019 05:50  CT Cervical Spine Wo Contrast  Result Date: 06/09/2019 CLINICAL DATA:  Patient with recent syncopal episode. EXAM: CT HEAD WITHOUT CONTRAST CT CERVICAL SPINE WITHOUT CONTRAST TECHNIQUE: Multidetector CT imaging of the head and cervical spine was performed following the standard protocol without intravenous contrast. Multiplanar CT image reconstructions of the cervical spine were also generated. COMPARISON:  Brain CT 07/26/2006 FINDINGS: CT HEAD FINDINGS Brain: Ventricles and sulci are appropriate for patient's age. No evidence for acute cortically based infarct, intracranial hemorrhage, mass lesion or mass-effect. Vascular: Unremarkable Skull: Intact. Sinuses/Orbits: Polypoid mucosal thickening involving the frontal sinus (image 13; series 4). Remainder the paranasal sinuses are well aerated. Mastoid air cells are unremarkable. Orbits are unremarkable. Other: None. CT CERVICAL SPINE FINDINGS Alignment: Reversal of the normal cervical lordosis. Skull base and vertebrae: Intact. Soft tissues and spinal canal: No prevertebral fluid or swelling. No visible canal hematoma. Disc levels: Degenerative disc disease most pronounced C5-6, C6-7 and C7-T1. No evidence for acute fracture. Upper chest: Unremarkable Other: None IMPRESSION: No acute intracranial process. Polypoid mucosal thickening involving the frontal sinus. No acute cervical spine fracture.  Degenerative disc disease. Electronically Signed   By: Annia Belt M.D.   On: 06/09/2019 05:50   ECHOCARDIOGRAM COMPLETE  Result Date: 06/09/2019   ECHOCARDIOGRAM REPORT   Patient Name:   Joseph Todd Date of Exam: 06/09/2019 Medical Rec #:  [id number]    Height:       68.0 in Accession #:    [id number]   Weight:       191.0 lb Date of Birth:  1955/06/21    BSA:          2.00 m Patient Age:    63 years     BP:           96/81 mmHg Patient  Gender: M            HR:           112 bpm. Exam Location:  Inpatient Procedure: 2D Echo, Color Doppler and Cardiac Doppler Indications:    R94.31 Abnormal EKG  History:        Patient has no prior history of Echocardiogram examinations.                 Signs/Symptoms:Syncope.  Sonographer:    Irving Burton Senior RDCS Referring Phys: [id number] Ernest Mallick IMPRESSIONS  1. Left ventricular ejection fraction, by visual estimation, is 60 to 65%. The left ventricle has normal function. There is no left ventricular hypertrophy.  2. Left ventricular diastolic parameters are consistent with Grade I diastolic dysfunction (impaired relaxation).  3. The left ventricle has no regional wall motion abnormalities.  4. Global right ventricle has normal systolic function.The right ventricular size is normal. No increase in right ventricular wall thickness.  5. Left atrial size was normal.  6. Right atrial size was normal.  7. The mitral valve is normal in structure. No evidence of mitral valve regurgitation. No evidence of mitral stenosis.  8. The tricuspid valve is normal in structure.  9. The tricuspid valve is normal in structure. Tricuspid valve regurgitation is trivial. 10. The aortic valve has an indeterminant number of cusps. Aortic valve regurgitation is not visualized. No evidence of aortic valve sclerosis or stenosis. 11. The pulmonic valve was not well visualized. Pulmonic valve regurgitation is not visualized. 12. Normal pulmonary artery systolic pressure. 13. The inferior vena cava is normal in size with greater than 50% respiratory variability, suggesting right atrial pressure of 3 mmHg. FINDINGS  Left Ventricle: Left ventricular ejection fraction, by visual estimation, is 60 to 65%. The left ventricle has normal function. The left ventricle has no regional wall motion abnormalities. There is no left ventricular hypertrophy. Left ventricular diastolic parameters are consistent with Grade I diastolic dysfunction (impaired  relaxation). Normal left atrial pressure. Right Ventricle: The right ventricular size is normal. No increase in right ventricular wall thickness. Global RV systolic function is has normal systolic function. The tricuspid regurgitant velocity is 1.72 m/s, and with an assumed right atrial pressure  of 3 mmHg, the estimated right ventricular systolic pressure is normal at 14.8 mmHg. Left Atrium: Left atrial size was normal in size. Right Atrium: Right atrial size was normal in size Pericardium: There is no evidence of pericardial effusion. Mitral Valve: The mitral valve is normal in structure. No evidence of mitral valve regurgitation. No evidence of mitral valve stenosis by observation. Tricuspid Valve: The tricuspid valve is normal in structure. Tricuspid valve regurgitation is trivial. Aortic Valve: The aortic valve has an indeterminant number of cusps. Aortic valve regurgitation is not visualized. The aortic valve is structurally normal, with no evidence of sclerosis or stenosis. Aortic valve mean gradient measures 2.7 mmHg. Aortic valve peak gradient measures 4.8 mmHg. Aortic valve area, by VTI measures 3.93 cm. Pulmonic Valve: The pulmonic valve was not well visualized. Pulmonic valve regurgitation is not visualized. Pulmonic regurgitation is not visualized. No evidence of pulmonic stenosis. Aorta: The aortic root is normal in size and structure. Pulmonary Artery: Indeterminate PASP, inadequate TR jet. Venous: The inferior vena cava is normal in size with greater than 50% respiratory variability, suggesting right atrial pressure of 3 mmHg. IAS/Shunts: No atrial level shunt detected by color flow Doppler.  LEFT VENTRICLE PLAX 2D LVIDd:         4.10 cm  Diastology LVIDs:         2.40 cm  LV e' lateral:   5.55 cm/s LV PW:         0.80 cm  LV E/e' lateral: 10.5 LV IVS:        0.80 cm  LV e' medial:    4.90 cm/s LVOT diam:     2.10 cm  LV E/e' medial:  11.9 LV SV:         54 ml LV SV Index:   26.24 LVOT Area:      3.46 cm  RIGHT VENTRICLE RV S prime:     13.30 cm/s TAPSE (M-mode): 1.7 cm LEFT ATRIUM             Index       RIGHT ATRIUM          Index LA diam:        3.50 cm 1.75 cm/m  RA Area:     7.18 cm LA Vol (A2C):   22.8 ml 11.38 ml/m RA Volume:   10.70 ml 5.34 ml/m LA Vol (A4C):   30.3 ml 15.12 ml/m LA Biplane Vol: 27.0 ml 13.47 ml/m  AORTIC VALVE AV Area (Vmax):    3.02 cm AV Area (Vmean):   3.12 cm AV Area (VTI):     3.93 cm AV Vmax:           109.95 cm/s AV Vmean:          78.934 cm/s AV VTI:            0.144 m AV Peak Grad:      4.8 mmHg AV Mean Grad:  2.7 mmHg LVOT Vmax:         95.80 cm/s LVOT Vmean:        71.100 cm/s LVOT VTI:          0.163 m LVOT/AV VTI ratio: 1.13  AORTA Ao Root diam: 3.50 cm Ao Asc diam:  3.00 cm MITRAL VALVE                        TRICUSPID VALVE MV Area (PHT): 3.77 cm             TR Peak grad:   11.8 mmHg MV PHT:        58.29 msec           TR Vmax:        172.00 cm/s MV Decel Time: 201 msec MV E velocity: 58.30 cm/s 103 cm/s  SHUNTS MV A velocity: 75.20 cm/s 70.3 cm/s Systemic VTI:  0.16 m MV E/A ratio:  0.78       1.5       Systemic Diam: 2.10 cm  Dina Rich MD Electronically signed by Dina Rich MD Signature Date/Time: 06/09/2019/11:54:53 AM    Final     Pending Labs Unresulted Labs (From admission, onward)    Start     Ordered   06/16/19 0500  Creatinine, serum  (enoxaparin (LOVENOX)    CrCl >/= 30 ml/min)  Weekly,   R    Comments: while on enoxaparin therapy    06/09/19 0810   06/10/19 0500  Basic metabolic panel  Tomorrow morning,   R     06/09/19 0810   06/10/19 0500  CBC  Tomorrow morning,   R     06/09/19 0810   06/09/19 0427  Urinalysis, Routine w reflex microscopic  Once,   STAT     06/09/19 0426          Vitals/Pain Today's Vitals   06/09/19 1930 06/09/19 1943 06/09/19 1944 06/09/19 1945  BP: 102/86     Pulse: (!) 121  (!) 123   Resp: 17  (!) 27   Temp:      TempSrc:      SpO2: 93%  96%   Weight:      Height:      PainSc:   0-No pain  0-No pain    Isolation Precautions No active isolations  Medications Medications  sodium chloride flush (NS) 0.9 % injection 3 mL (3 mLs Intravenous Not Given 06/09/19 0830)  insulin aspart (novoLOG) injection 0-15 Units (3 Units Subcutaneous Given 06/09/19 1830)  enoxaparin (LOVENOX) injection 40 mg (40 mg Subcutaneous Not Given 06/09/19 1858)  senna-docusate (Senokot-S) tablet 1 tablet (has no administration in time range)  promethazine (PHENERGAN) tablet 12.5 mg (has no administration in time range)  acetaminophen (TYLENOL) tablet 650 mg (650 mg Oral Given 06/09/19 1834)    Mobility walks Moderate fall risk   Focused Assessments Cardiac Assessment Handoff:  Cardiac Rhythm: Sinus tachycardia No results found for: CKTOTAL, CKMB, CKMBINDEX, TROPONINI No results found for: DDIMER Does the Patient currently have chest pain? No      R Recommendations: See Admitting Provider Note  Report given to:   Additional Notes:

## 2019-06-09 NOTE — Progress Notes (Signed)
Consult note reviewed from overnight cardiology, seen just at 630AM today. Follow up workup including echo.   Dominga Ferry MD

## 2019-06-09 NOTE — ED Notes (Signed)
Pt requesting to leave AMA. Pt states he has to work in the morning and has a doctor appointment on Wednesday that he is not able to reschedule. RN discussed staying with pt. RN paged Ephriam Knuckles, MD. Md states that they will come by and talk with pt.

## 2019-06-09 NOTE — ED Notes (Signed)
DINNER TRAY ORDERED 4:51

## 2019-06-09 NOTE — ED Notes (Signed)
C-Collar applied

## 2019-06-09 NOTE — ED Notes (Signed)
Pt CBG 220 notified Magda Paganini, [place]

## 2019-06-09 NOTE — Progress Notes (Signed)
Called patient's wife who was updated regarding the patient's syncope episode likely being vasovagal and was told about cardiac workup.   Patient's wife is the primary point of contact. Please only call her and not daughter.   Lorenso Courier, MD Internal Medicine PGY3 06/09/2019, 5:02 PM

## 2019-06-10 DIAGNOSIS — G5601 Carpal tunnel syndrome, right upper limb: Secondary | ICD-10-CM

## 2019-06-10 DIAGNOSIS — M542 Cervicalgia: Secondary | ICD-10-CM

## 2019-06-10 DIAGNOSIS — E118 Type 2 diabetes mellitus with unspecified complications: Secondary | ICD-10-CM

## 2019-06-10 DIAGNOSIS — R55 Syncope and collapse: Secondary | ICD-10-CM | POA: Diagnosis not present

## 2019-06-10 DIAGNOSIS — E871 Hypo-osmolality and hyponatremia: Secondary | ICD-10-CM

## 2019-06-10 DIAGNOSIS — Z7984 Long term (current) use of oral hypoglycemic drugs: Secondary | ICD-10-CM

## 2019-06-10 DIAGNOSIS — E119 Type 2 diabetes mellitus without complications: Secondary | ICD-10-CM

## 2019-06-10 DIAGNOSIS — N179 Acute kidney failure, unspecified: Secondary | ICD-10-CM

## 2019-06-10 DIAGNOSIS — I319 Disease of pericardium, unspecified: Secondary | ICD-10-CM | POA: Diagnosis not present

## 2019-06-10 LAB — CBC
HCT: 51.6 % (ref 39.0–52.0)
Hemoglobin: 16.9 g/dL (ref 13.0–17.0)
MCH: 29.5 pg (ref 26.0–34.0)
MCHC: 32.8 g/dL (ref 30.0–36.0)
MCV: 90.1 fL (ref 80.0–100.0)
Platelets: 224 10*3/uL (ref 150–400)
RBC: 5.73 MIL/uL (ref 4.22–5.81)
RDW: 12.6 % (ref 11.5–15.5)
WBC: 3.8 10*3/uL — ABNORMAL LOW (ref 4.0–10.5)
nRBC: 0 % (ref 0.0–0.2)

## 2019-06-10 LAB — BASIC METABOLIC PANEL
Anion gap: 11 (ref 5–15)
BUN: 12 mg/dL (ref 8–23)
CO2: 27 mmol/L (ref 22–32)
Calcium: 8.9 mg/dL (ref 8.9–10.3)
Chloride: 94 mmol/L — ABNORMAL LOW (ref 98–111)
Creatinine, Ser: 1.28 mg/dL — ABNORMAL HIGH (ref 0.61–1.24)
GFR calc Af Amer: >60 mL/min (ref >60)
GFR calc non Af Amer: >60 mL/min (ref >60)
Glucose, Bld: 195 mg/dL — ABNORMAL HIGH (ref 70–99)
Potassium: 4.6 mmol/L (ref 3.5–5.1)
Sodium: 132 mmol/L — ABNORMAL LOW (ref 135–145)

## 2019-06-10 LAB — GLUCOSE, CAPILLARY: Glucose-Capillary: 174 mg/dL — ABNORMAL HIGH (ref 70–99)

## 2019-06-10 MED ORDER — SENNA 8.6 MG PO CAPS: 8.6000 mg | ORAL_CAPSULE | Freq: Every day | ORAL | 0 refills | Status: AC

## 2019-06-10 NOTE — Progress Notes (Addendum)
Subjective: HD#0 Events Overnight: none overnight  Patient was seen this morning on rounds. He was sitting up at the side of his bed. He mentioned he was eager to get home. He recalls that he had a temperature elevation prior to losing consciousness. Has one bowel movement per day and thinks that it is firm.   Objective:  Vital signs in last 24 hours: Vitals:   06/09/19 2100 06/09/19 2121 06/09/19 2142 06/10/19 0456  BP: 117/85  (!) 144/84 112/79  Pulse: (!) 103 99 (!) 105 100  Resp: (!) 29 (!) 23 18 18   Temp:   98.3 F (36.8 C) 99.4 F (37.4 C)  TempSrc:   Oral   SpO2:  95% 93% 95%  Weight:    80.7 kg  Height:        Physical Exam: Physical Exam  Constitutional: He is oriented to person, place, and time and well-developed, well-nourished, and in no distress.  HENT:  Head: Normocephalic and atraumatic.  Eyes: EOM are normal.  Cardiovascular: Normal rate, regular rhythm, normal heart sounds and intact distal pulses. Exam reveals no gallop and no friction rub.  No murmur heard. Pulmonary/Chest: Effort normal. No respiratory distress.  Abdominal: Soft. Bowel sounds are normal. He exhibits no distension.  Musculoskeletal:        General: Normal range of motion.     Cervical back: Normal range of motion.  Neurological: He is alert and oriented to person, place, and time.  Skin: Skin is warm and dry.    Filed Weights   06/09/19 0500 06/10/19 0456  Weight: 86.6 kg 80.7 kg     Intake/Output Summary (Last 24 hours) at 06/10/2019 0516 Last data filed at 06/09/2019 2200 Gross per 24 hour  Intake 240 ml  Output --  Net 240 ml     Assessment/Plan:  Principal Problem:   Syncope Active Problems:   Diabetes ([place])   Acute kidney injury ([place])  Patient Summary: Joseph Todd is a 64 y.o. with pertinent PMH of type 2 diabetes, right arm carpal tunnel, admit for syncope complicated by pericarditis, acute kidney injury on hospital day 0  #Situational Syncope - patient CT of  the head was negative for intracranial processes, orthostatic vitals were negative for orthostatic hypotension.  Although patient currently has pericarditis he does not have a history of structural heart disease.  Echo performed yesterday shows an ejection fraction of 60 to 65% with grade 1 diastolic dysfunction with no regional wall motion abnormalities or valvular insufficiencies. Patient signs and symptoms are consistent with vasovagal syncope.  We talked about supportive care and measures to help reduce his risk of syncope in the future. -prescribed senna to prevent constipation  -patient was given precations about how to prevent syncope in the future  -compression stockings on discharge   #Pericarditis - EKG with subtle ST changes and PR changes that could be consistent with a subacute pericarditis.  Patient currently asymptomatic.  No elevation in troponins.  May be viral in etiology.  Nothing to do right now but will need to follow-up with his primary care physician.  #Neck Pain - Continue tylenol 650 mg PRN  #AKI #Hypertonic Hyponatermia Patient presented with a BUN/Cr ratio of 9.8. Creatinine of 1.42 and a GFR greater than 60 that has improved to 1.28 and GFR > 60. Baseline of 1.07.  Patient's hypotonic hyponatremia secondary to blood glucose of 239 on admission.  #T2DM - patient has a hemoglobin A1c of 11.9.  Blood glucose on admission was  greater than 200. -Continue sliding scale insulin -Continue Metformin at discharge and follow-up with primary care physician   Diet: Carb modified IVF: None VTE: Lovenox Code: Full  Dispo: Anticipated discharge today.   Dellia Cloud, D.O. MCIMTP, PGY-1 Date 06/10/2019 Time 5:16 AM Pager: [phone]

## 2019-06-10 NOTE — Discharge Instructions (Signed)
     Syncope Syncope is when you pass out (faint) for a short time. It is caused by a sudden decrease in blood flow to the brain. Signs that you may be about to pass out include:  Feeling dizzy or light-headed.  Feeling sick to your stomach (nauseous).  Seeing all white or all black.  Having cold, clammy skin. If you pass out, get help right away. Call your local emergency services (911 in the [place] Do not drive yourself to the hospital. Follow these instructions at home: Watch for any changes in your symptoms. Take these actions to stay safe and help with your symptoms: Lifestyle  Do not drive, use machinery, or play sports until your doctor says it is okay.  Do not drink alcohol.  Do not use any products that contain nicotine or tobacco, such as cigarettes and e-cigarettes. If you need help quitting, ask your doctor.  Drink enough fluid to keep your pee (urine) pale yellow. General instructions  Take over-the-counter and prescription medicines only as told by your doctor.  If you are taking blood pressure or heart medicine, sit up and stand up slowly. Spend a few minutes getting ready to sit and then stand. This can help you feel less dizzy.  Have someone stay with you until you feel stable.  If you start to feel like you might pass out, lie down right away and raise (elevate) your feet above the level of your heart. Breathe deeply and steadily. Wait until all of the symptoms are gone.  Keep all follow-up visits as told by your doctor. This is important. Get help right away if:  You have a very bad headache.  You pass out once or more than once.  You have pain in your chest, belly, or back.  You have a very fast or uneven heartbeat (palpitations).  It hurts to breathe.  You are bleeding from your mouth or your bottom (rectum).  You have black or tarry poop (stool).  You have jerky movements that you cannot control (seizure).  You are confused.  You have  trouble walking.  You are very weak.  You have vision problems. These symptoms may be an emergency. Do not wait to see if the symptoms will go away. Get medical help right away. Call your local emergency services (911 in the [place] Do not drive yourself to the hospital. Summary  Syncope is when you pass out (faint) for a short time. It is caused by a sudden decrease in blood flow to the brain.  Signs that you may be about to faint include feeling dizzy, light-headed, or sick to your stomach, seeing all white or all black, or having cold, clammy skin.  If you start to feel like you might pass out, lie down right away and raise (elevate) your feet above the level of your heart. Breathe deeply and steadily. Wait until all of the symptoms are gone. This information is not intended to replace advice given to you by your health care provider. Make sure you discuss any questions you have with your health care provider. Document Revised: 06/07/2017 Document Reviewed: 06/07/2017 Elsevier Patient Education  2020 Elsevier Inc.  

## 2019-06-10 NOTE — Plan of Care (Signed)

## 2019-06-10 NOTE — Discharge Summary (Addendum)
Name: Joseph Todd MRN: [id number] DOB: 1956/01/23 64 y.o. PCP: Patient, No Pcp Per  Date of Admission: 06/09/2019  4:23 AM Date of Discharge: 06/10/2019 Attending Physician: Erlinda Hong, MD  Discharge Diagnosis: 1. Situational Syncope  Discharge Medications: Allergies as of 06/10/2019   No Known Allergies      Medication List     TAKE these medications    metFORMIN 500 MG tablet Commonly known as: GLUCOPHAGE Take 500 mg by mouth daily with breakfast.   Senna 8.6 MG Caps Take 8.6 mg by mouth daily.   thiamine 100 MG tablet Take 100 mg by mouth 2 (two) times daily.   Vitamin D (Ergocalciferol) 1.25 MG (50000 UNIT) Caps capsule Commonly known as: DRISDOL Take 50,000 Units by mouth once a week. Notes to patient: See directions        Disposition and follow-up:   Mr.Joseph Todd was discharged from [facility] in Good condition.  At the hospital follow up visit please address:  1.  Diabetes and AKI and pericarditis  2.  Labs / imaging needed at time of follow-up: none  3.  Pending labs/ test needing follow-up: BMP, EKG  Follow-up Appointments: Follow-up Information     [facility] on 06/10/2019.   Contact information: [facility] Course by problem list: 1. Situational Syncope - Patient presented to [facility] after having an episode of syncope after having a BM. Ct was negative for intracranial processes. Echo showed EF of 60-65% with a grade 1 diastolic dysfunction. Otherwise, no other structural heart disease. Medication review showed no medications concerning for orthostasis. Orthostatic labs are negative. Syncope likely vasovagal, secondary to bowel movement.  2. Pericarditis - Patient presented with EKG finding consistent with pericarditis. Etiology is unknown. He will need follow up with PCP and repeat EKG.  Discharge Vitals:   BP 105/72 (BP Location: Right Arm)   Pulse (!) 106   Temp 97.8 F (36.6 C)    Resp 19   Ht 5\' 8"  (1.727 m)   Wt 80.7 kg Comment: scale b  SpO2 92%   BMI 27.05 kg/m   Pertinent Labs, Studies, and Procedures:  CBC Latest Ref Rng & Units 06/10/2019 06/09/2019 08/05/2008  WBC 4.0 - 10.5 K/uL 3.8(L) 6.4 5.3  Hemoglobin 13.0 - 17.0 g/dL 08/07/2008 84.1 32.4  Hematocrit 39.0 - 52.0 % 51.6 51.2 43.0  Platelets 150 - 400 K/uL 224 211 374   CMP Latest Ref Rng & Units 06/10/2019 06/09/2019 08/05/2008  Glucose 70 - 99 mg/dL 08/07/2008) 027(O) 536(U)  BUN 8 - 23 mg/dL 12 17 5(L)  Creatinine 0.61 - 1.24 mg/dL 440(H) 4.74(Q) 5.95(G  Sodium 135 - 145 mmol/L 132(L) 130(L) 139  Potassium 3.5 - 5.1 mmol/L 4.6 4.1 4.1  Chloride 98 - 111 mmol/L 94(L) 94(L) 105  CO2 22 - 32 mmol/L 27 25 26   Calcium 8.9 - 10.3 mg/dL 8.9 3.87) 9.0   CT of head - no acute intracranial process. Polypoid mucosal thickening in the frontal sinus.   Discharge Instructions: Discharge Instructions     Diet - low sodium heart healthy   Complete by: As directed    Discharge instructions   Complete by: As directed    You were hospitalized for Syncope. Thank you for allowing to be part of your care.   We arranged for you to follow up at: please follow up with your PCP on Wednesday at [facility]   Please  note these changes made to your medications:  - See after visit summary - We have ordered you compression stockings and senna for bowel movements.  Please make sure to follow up with your PCP  Please call our clinic if you have any questions or concerns, we may be able to help and keep you from a long and expensive emergency room wait. Our clinic and after hours phone number is [phone], the best time to call is Monday through Friday 9 am to 4 pm but there is always someone available 24/7 if you have an emergency. If you need medication refills please notify your pharmacy one week in advance and they will send [place] a request.   Increase activity slowly   Complete by: As directed        Signed: Marianna Payment, MD  06/11/2019, 7:40 PM   Pager: [phone]

## 2019-06-14 ENCOUNTER — Inpatient Hospital Stay (HOSPITAL_COMMUNITY)
Admission: EM | Admit: 2019-06-14 | Discharge: 2019-06-25 | DRG: 177 | Disposition: A | Discharge status: Home Health | Payer: No Typology Code available for payment source | Attending: Internal Medicine | Admitting: Internal Medicine

## 2019-06-14 ENCOUNTER — Emergency Department (HOSPITAL_COMMUNITY): Payer: No Typology Code available for payment source

## 2019-06-14 ENCOUNTER — Other Ambulatory Visit: Payer: Self-pay

## 2019-06-14 DIAGNOSIS — E119 Type 2 diabetes mellitus without complications: Secondary | ICD-10-CM

## 2019-06-14 DIAGNOSIS — E875 Hyperkalemia: Secondary | ICD-10-CM

## 2019-06-14 DIAGNOSIS — T380X5A Adverse effect of glucocorticoids and synthetic analogues, initial encounter: Secondary | ICD-10-CM | POA: Diagnosis not present

## 2019-06-14 DIAGNOSIS — R609 Edema, unspecified: Secondary | ICD-10-CM | POA: Diagnosis not present

## 2019-06-14 DIAGNOSIS — Z79899 Other long term (current) drug therapy: Secondary | ICD-10-CM | POA: Diagnosis not present

## 2019-06-14 DIAGNOSIS — I10 Essential (primary) hypertension: Secondary | ICD-10-CM | POA: Diagnosis present

## 2019-06-14 DIAGNOSIS — E1165 Type 2 diabetes mellitus with hyperglycemia: Secondary | ICD-10-CM | POA: Diagnosis not present

## 2019-06-14 DIAGNOSIS — Z7984 Long term (current) use of oral hypoglycemic drugs: Secondary | ICD-10-CM

## 2019-06-14 DIAGNOSIS — J811 Chronic pulmonary edema: Secondary | ICD-10-CM | POA: Diagnosis present

## 2019-06-14 DIAGNOSIS — J9601 Acute respiratory failure with hypoxia: Secondary | ICD-10-CM | POA: Diagnosis present

## 2019-06-14 DIAGNOSIS — J9600 Acute respiratory failure, unspecified whether with hypoxia or hypercapnia: Secondary | ICD-10-CM | POA: Diagnosis not present

## 2019-06-14 DIAGNOSIS — R05 Cough: Secondary | ICD-10-CM | POA: Diagnosis present

## 2019-06-14 DIAGNOSIS — J1282 Pneumonia due to coronavirus disease 2019: Secondary | ICD-10-CM | POA: Diagnosis present

## 2019-06-14 DIAGNOSIS — U071 COVID-19: Principal | ICD-10-CM | POA: Diagnosis present

## 2019-06-14 HISTORY — DX: Abnormal electrocardiogram (ECG) (EKG): R94.31

## 2019-06-14 HISTORY — DX: Type 2 diabetes mellitus without complications: E11.9

## 2019-06-14 HISTORY — DX: Syncope and collapse: R55

## 2019-06-14 HISTORY — DX: Cervicalgia: M54.2

## 2019-06-14 HISTORY — DX: Carpal tunnel syndrome, right upper limb: G56.01

## 2019-06-14 LAB — CBC WITH DIFFERENTIAL/PLATELET
Abs Immature Granulocytes: 0.12 10*3/uL — ABNORMAL HIGH (ref 0.00–0.07)
Basophils Absolute: 0 10*3/uL (ref 0.0–0.1)
Basophils Relative: 0 %
Eosinophils Absolute: 0.1 10*3/uL (ref 0.0–0.5)
Eosinophils Relative: 1 %
HCT: 44.9 % (ref 39.0–52.0)
Hemoglobin: 15 g/dL (ref 13.0–17.0)
Immature Granulocytes: 1 %
Lymphocytes Relative: 15 %
Lymphs Abs: 1.2 10*3/uL (ref 0.7–4.0)
MCH: 29.5 pg (ref 26.0–34.0)
MCHC: 33.4 g/dL (ref 30.0–36.0)
MCV: 88.2 fL (ref 80.0–100.0)
Monocytes Absolute: 0.3 10*3/uL (ref 0.1–1.0)
Monocytes Relative: 4 %
Neutro Abs: 6.6 10*3/uL (ref 1.7–7.7)
Neutrophils Relative %: 79 %
Platelets: 274 10*3/uL (ref 150–400)
RBC: 5.09 MIL/uL (ref 4.22–5.81)
RDW: 12.5 % (ref 11.5–15.5)
WBC: 8.3 10*3/uL (ref 4.0–10.5)
nRBC: 0 % (ref 0.0–0.2)

## 2019-06-14 LAB — COMPREHENSIVE METABOLIC PANEL
ALT: 56 U/L — ABNORMAL HIGH (ref 0–44)
AST: 48 U/L — ABNORMAL HIGH (ref 15–41)
Albumin: 2.7 g/dL — ABNORMAL LOW (ref 3.5–5.0)
Alkaline Phosphatase: 58 U/L (ref 38–126)
Anion gap: 11 (ref 5–15)
BUN: 15 mg/dL (ref 8–23)
CO2: 25 mmol/L (ref 22–32)
Calcium: 8.2 mg/dL — ABNORMAL LOW (ref 8.9–10.3)
Chloride: 96 mmol/L — ABNORMAL LOW (ref 98–111)
Creatinine, Ser: 1.04 mg/dL (ref 0.61–1.24)
GFR calc Af Amer: >60 mL/min (ref >60)
GFR calc non Af Amer: >60 mL/min (ref >60)
Glucose, Bld: 212 mg/dL — ABNORMAL HIGH (ref 70–99)
Potassium: 4.2 mmol/L (ref 3.5–5.1)
Sodium: 132 mmol/L — ABNORMAL LOW (ref 135–145)
Total Bilirubin: 1.3 mg/dL — ABNORMAL HIGH (ref 0.3–1.2)
Total Protein: 7.1 g/dL (ref 6.5–8.1)

## 2019-06-14 LAB — RESPIRATORY PANEL BY RT PCR (FLU A&B, COVID)
Influenza A by PCR: NEGATIVE
Influenza B by PCR: NEGATIVE
SARS Coronavirus 2 by RT PCR: POSITIVE — AB

## 2019-06-14 LAB — BRAIN NATRIURETIC PEPTIDE: B Natriuretic Peptide: 67.1 pg/mL (ref 0.0–100.0)

## 2019-06-14 MED ORDER — SODIUM CHLORIDE 0.9 % IV SOLN: 250.0000 mL | INTRAVENOUS | Status: DC | PRN

## 2019-06-14 MED ORDER — SODIUM CHLORIDE 0.9 % IV SOLN: 100.0000 mg | Freq: Every day | INTRAVENOUS | Status: DC

## 2019-06-14 MED ORDER — ENOXAPARIN SODIUM 40 MG/0.4ML ~~LOC~~ SOLN
40.0000 mg | SUBCUTANEOUS | Status: DC
  Administered 2019-06-15 – 2019-06-17 (×4): 40 mg via SUBCUTANEOUS
  Filled 2019-06-14 (×4): qty 0.4

## 2019-06-14 MED ORDER — INSULIN DETEMIR 100 UNIT/ML ~~LOC~~ SOLN
0.0750 [IU]/kg | Freq: Two times a day (BID) | SUBCUTANEOUS | Status: DC
  Administered 2019-06-15 (×3): 6 [IU] via SUBCUTANEOUS
  Filled 2019-06-14 (×6): qty 0.06

## 2019-06-14 MED ORDER — DEXAMETHASONE SODIUM PHOSPHATE 10 MG/ML IJ SOLN
6.0000 mg | Freq: Once | INTRAMUSCULAR | Status: AC
  Administered 2019-06-14: 21:00:00 6 mg via INTRAVENOUS
  Filled 2019-06-14: qty 1

## 2019-06-14 MED ORDER — SODIUM CHLORIDE 0.9 % IV SOLN: 200.0000 mg | Freq: Once | INTRAVENOUS | Status: DC

## 2019-06-14 MED ORDER — INSULIN ASPART 100 UNIT/ML ~~LOC~~ SOLN
0.0000 [IU] | Freq: Every day | SUBCUTANEOUS | Status: DC
  Administered 2019-06-15: 21:00:00 3 [IU] via SUBCUTANEOUS
  Administered 2019-06-15 – 2019-06-18 (×4): 2 [IU] via SUBCUTANEOUS
  Administered 2019-06-19: 3 [IU] via SUBCUTANEOUS
  Administered 2019-06-20 – 2019-06-21 (×2): 4 [IU] via SUBCUTANEOUS
  Filled 2019-06-14: qty 0.05

## 2019-06-14 MED ORDER — SODIUM CHLORIDE 0.9 % IV SOLN
200.0000 mg | Freq: Once | INTRAVENOUS | Status: AC
  Administered 2019-06-14: 22:00:00 200 mg via INTRAVENOUS
  Filled 2019-06-14: qty 200

## 2019-06-14 MED ORDER — SODIUM CHLORIDE 0.9% FLUSH
3.0000 mL | Freq: Two times a day (BID) | INTRAVENOUS | Status: DC
  Administered 2019-06-15 – 2019-06-25 (×21): 3 mL via INTRAVENOUS

## 2019-06-14 MED ORDER — DEXAMETHASONE SODIUM PHOSPHATE 10 MG/ML IJ SOLN: 6.0000 mg | INTRAMUSCULAR | Status: DC

## 2019-06-14 MED ORDER — ONDANSETRON HCL 4 MG PO TABS: 4.0000 mg | ORAL_TABLET | Freq: Four times a day (QID) | ORAL | Status: DC | PRN

## 2019-06-14 MED ORDER — SODIUM CHLORIDE 0.9% FLUSH: 3.0000 mL | INTRAVENOUS | Status: DC | PRN

## 2019-06-14 MED ORDER — SODIUM CHLORIDE 0.9 % IV SOLN: INTRAVENOUS | Status: DC

## 2019-06-14 MED ORDER — ZINC SULFATE 220 (50 ZN) MG PO CAPS
220.0000 mg | ORAL_CAPSULE | Freq: Every day | ORAL | Status: DC
  Administered 2019-06-15 – 2019-06-25 (×12): 220 mg via ORAL
  Filled 2019-06-14 (×12): qty 1

## 2019-06-14 MED ORDER — ACETAMINOPHEN 325 MG PO TABS
650.0000 mg | ORAL_TABLET | Freq: Four times a day (QID) | ORAL | Status: DC | PRN
  Administered 2019-06-15 – 2019-06-19 (×3): 650 mg via ORAL
  Filled 2019-06-14 (×3): qty 2

## 2019-06-14 MED ORDER — ONDANSETRON HCL 4 MG/2ML IJ SOLN: 4.0000 mg | Freq: Four times a day (QID) | INTRAMUSCULAR | Status: DC | PRN

## 2019-06-14 MED ORDER — HYDROMORPHONE HCL 1 MG/ML IJ SOLN
1.0000 mg | Freq: Once | INTRAMUSCULAR | Status: AC
  Administered 2019-06-14: 18:00:00 1 mg via INTRAVENOUS
  Filled 2019-06-14: qty 1

## 2019-06-14 MED ORDER — SODIUM CHLORIDE 0.9 % IV SOLN
100.0000 mg | Freq: Every day | INTRAVENOUS | Status: AC
  Administered 2019-06-15 – 2019-06-18 (×4): 100 mg via INTRAVENOUS
  Filled 2019-06-14 (×4): qty 20

## 2019-06-14 MED ORDER — INSULIN ASPART 100 UNIT/ML ~~LOC~~ SOLN
0.0000 [IU] | Freq: Three times a day (TID) | SUBCUTANEOUS | Status: DC
  Administered 2019-06-15: 5 [IU] via SUBCUTANEOUS
  Administered 2019-06-15: 2 [IU] via SUBCUTANEOUS
  Administered 2019-06-15 – 2019-06-16 (×3): 5 [IU] via SUBCUTANEOUS
  Administered 2019-06-16 – 2019-06-17 (×2): 2 [IU] via SUBCUTANEOUS
  Administered 2019-06-17: 7 [IU] via SUBCUTANEOUS
  Administered 2019-06-17: 2 [IU] via SUBCUTANEOUS
  Administered 2019-06-18: 5 [IU] via SUBCUTANEOUS
  Administered 2019-06-19: 08:00:00 1 [IU] via SUBCUTANEOUS
  Administered 2019-06-19: 2 [IU] via SUBCUTANEOUS
  Administered 2019-06-19: 7 [IU] via SUBCUTANEOUS
  Administered 2019-06-20: 9 [IU] via SUBCUTANEOUS
  Administered 2019-06-20: 5 [IU] via SUBCUTANEOUS
  Administered 2019-06-20: 1 [IU] via SUBCUTANEOUS
  Administered 2019-06-21: 9 [IU] via SUBCUTANEOUS
  Administered 2019-06-21: 1 [IU] via SUBCUTANEOUS
  Administered 2019-06-21: 3 [IU] via SUBCUTANEOUS
  Administered 2019-06-22: 13:00:00 2 [IU] via SUBCUTANEOUS
  Administered 2019-06-22 – 2019-06-23 (×2): 9 [IU] via SUBCUTANEOUS
  Administered 2019-06-23: 3 [IU] via SUBCUTANEOUS
  Administered 2019-06-23: 17:00:00 9 [IU] via SUBCUTANEOUS
  Administered 2019-06-24: 5 [IU] via SUBCUTANEOUS
  Administered 2019-06-24: 08:00:00 3 [IU] via SUBCUTANEOUS
  Administered 2019-06-24: 13:00:00 5 [IU] via SUBCUTANEOUS
  Administered 2019-06-25: 09:00:00 1 [IU] via SUBCUTANEOUS
  Administered 2019-06-25: 2 [IU] via SUBCUTANEOUS
  Administered 2019-06-25: 3 [IU] via SUBCUTANEOUS
  Filled 2019-06-14: qty 0.09

## 2019-06-14 MED ORDER — ONDANSETRON HCL 4 MG/2ML IJ SOLN
4.0000 mg | Freq: Once | INTRAMUSCULAR | Status: AC
  Administered 2019-06-14: 18:00:00 4 mg via INTRAVENOUS
  Filled 2019-06-14: qty 2

## 2019-06-14 MED ORDER — ASCORBIC ACID 500 MG PO TABS
500.0000 mg | ORAL_TABLET | Freq: Every day | ORAL | Status: DC
  Administered 2019-06-15 – 2019-06-25 (×12): 500 mg via ORAL
  Filled 2019-06-14 (×12): qty 1

## 2019-06-14 NOTE — H&P (Signed)
History and Physical    Joseph Todd EHU:[id number] DOB: 02/22/1956 DOA: 06/14/2019  PCP: Patient, No Pcp Per  Patient coming from: home  Chief Complaint: sob  HPI: [name] is a 64 y.o. male with medical history significant of dm comes in with one week of weakness, worsening sob and nausea.  Pt was hospitalized last week with syncope, covid was neg at that time.  Since then progressive more sob.  subj fever.  No vomiting.  No diarrhea.  No cp or abd pain.  Pt hypoxic requiring nrb at this time and feels much better.  Referred for admission for covid pna with hypoxia.    Review of Systems: As per HPI otherwise 10 point review of systems negative.   No past medical history on file. dm    has no history on file for tobacco, alcohol, and drug. neg x 3  No Known Allergies  No family history on file. no preamture CAD  Prior to Admission medications   Medication Sig Start Date End Date Taking? Authorizing Provider  cyanocobalamin (,VITAMIN B-12,) 1000 MCG/ML injection Inject 1,000 mcg into the muscle every 30 (thirty) days.  05/24/19  Yes [provider]  metFORMIN (GLUCOPHAGE) 500 MG tablet Take 500 mg by mouth daily with breakfast.   Yes [provider]  Sennosides (SENNA) 8.6 MG CAPS Take 8.6 mg by mouth daily. 06/10/19  Yes Dellia Cloud, MD  thiamine 100 MG tablet Take 100 mg by mouth 2 (two) times daily. 04/25/19  Yes [provider]  Vitamin D, Ergocalciferol, (DRISDOL) 1.25 MG (50000 UNIT) CAPS capsule Take 50,000 Units by mouth once a week. 04/25/19  Yes [provider]    Physical Exam: Vitals:   06/14/19 2030 06/14/19 2058 06/14/19 2100 06/14/19 2130  BP: 116/80  119/65 129/88  Pulse: (!) 101 98 (!) 103 (!) 108  Resp: (!) 37 (!) 34 (!) 32 (!) 22  Temp:      SpO2: (!) 86% 92% 90% 92%  Weight:      Height:          Constitutional: NAD, calm, comfortable Vitals:   06/14/19 2030 06/14/19 2058 06/14/19 2100 06/14/19 2130  BP:  116/80  119/65 129/88  Pulse: (!) 101 98 (!) 103 (!) 108  Resp: (!) 37 (!) 34 (!) 32 (!) 22  Temp:      SpO2: (!) 86% 92% 90% 92%  Weight:      Height:       Eyes: PERRL, lids and conjunctivae normal ENMT: Mucous membranes are moist. Posterior pharynx clear of any exudate or lesions.Normal dentition.  Neck: normal, supple, no masses, no thyromegaly Respiratory: clear to auscultation bilaterally, no wheezing, no crackles. Normal respiratory effort. No accessory muscle use.  Cardiovascular: Regular rate and rhythm, no murmurs / rubs / gallops. No extremity edema. 2+ pedal pulses. No carotid bruits.  Abdomen: no tenderness, no masses palpated. No hepatosplenomegaly. Bowel sounds positive.  Musculoskeletal: no clubbing / cyanosis. No joint deformity upper and lower extremities. Good ROM, no contractures. Normal muscle tone.  Skin: no rashes, lesions, ulcers. No induration Neurologic: CN 2-12 grossly intact. Sensation intact, DTR normal. Strength 5/5 in all 4.  Psychiatric: Normal judgment and insight. Alert and oriented x 3. Normal mood.    Labs on Admission: I have personally reviewed following labs and imaging studies  CBC: Recent Labs  Lab 06/09/19 0459 06/10/19 0333 06/14/19 1825  WBC 6.4 3.8* 8.3  NEUTROABS  --   --  6.6  HGB 16.5 16.9 15.0  HCT 51.2 51.6 44.9  MCV 90.9 90.1 88.2  PLT 211 224 274   Basic Metabolic Panel: Recent Labs  Lab 06/09/19 0459 06/10/19 0333 06/14/19 1825  NA 130* 132* 132*  K 4.1 4.6 4.2  CL 94* 94* 96*  CO2 25 27 25   GLUCOSE 290* 195* 212*  BUN 17 12 15   CREATININE 1.42* 1.28* 1.04  CALCIUM 8.5* 8.9 8.2*   GFR: Estimated Creatinine Clearance: 72.2 mL/min (by C-G formula based on SCr of 1.04 mg/dL). Liver Function Tests: Recent Labs  Lab 06/14/19 1825  AST 48*  ALT 56*  ALKPHOS 58  BILITOT 1.3*  PROT 7.1  ALBUMIN 2.7*   No results for input(s): LIPASE, AMYLASE in the last 168 hours. No results for input(s): AMMONIA in the last  168 hours. Coagulation Profile: No results for input(s): INR, PROTIME in the last 168 hours. Cardiac Enzymes: No results for input(s): CKTOTAL, CKMB, CKMBINDEX, TROPONINI in the last 168 hours. BNP (last 3 results) No results for input(s): PROBNP in the last 8760 hours. HbA1C: No results for input(s): HGBA1C in the last 72 hours. CBG: Recent Labs  Lab 06/09/19 0837 06/09/19 1139 06/09/19 1700 06/09/19 2252 06/10/19 0638  GLUCAP 234* 220* 169* 206* 174*   Lipid Profile: No results for input(s): CHOL, HDL, LDLCALC, TRIG, CHOLHDL, LDLDIRECT in the last 72 hours. Thyroid Function Tests: No results for input(s): TSH, T4TOTAL, FREET4, T3FREE, THYROIDAB in the last 72 hours. Anemia Panel: No results for input(s): VITAMINB12, FOLATE, FERRITIN, TIBC, IRON, RETICCTPCT in the last 72 hours. Urine analysis: No results found for: COLORURINE, APPEARANCEUR, LABSPEC, PHURINE, GLUCOSEU, HGBUR, BILIRUBINUR, KETONESUR, PROTEINUR, UROBILINOGEN, NITRITE, LEUKOCYTESUR Sepsis Labs: !!!!!!!!!!!!!!!!!!!!!!!!!!!!!!!!!!!!!!!!!!!! @LABRCNTIP (procalcitonin:4,lacticidven:4) ) Recent Results (from the past 240 hour(s))  Respiratory Panel by RT PCR (Flu A&B, Covid) - Nasopharyngeal Swab     Status: None   Collection Time: 06/09/19  5:37 AM   Specimen: Nasopharyngeal Swab  Result Value Ref Range Status   SARS Coronavirus 2 by RT PCR NEGATIVE NEGATIVE Final    Comment: (NOTE) SARS-CoV-2 target nucleic acids are NOT DETECTED. The SARS-CoV-2 RNA is generally detectable in upper respiratoy specimens during the acute phase of infection. The lowest concentration of SARS-CoV-2 viral copies this assay can detect is 131 copies/mL. A negative result does not preclude SARS-Cov-2 infection and should not be used as the sole basis for treatment or other patient management decisions. A negative result may occur with  improper specimen collection/handling, submission of specimen other than nasopharyngeal swab, presence  of viral mutation(s) within the areas targeted by this assay, and inadequate number of viral copies (<131 copies/mL). A negative result must be combined with clinical observations, patient history, and epidemiological information. The expected result is Negative. Fact Sheet for Patients:  08/08/19 Fact Sheet for Healthcare Providers:  This test is not yet ap proved or cleared by the 06/11/19 FDA and  has been authorized for detection and/or diagnosis of SARS-CoV-2 by FDA under an Emergency Use Authorization (EUA). This EUA will remain  in effect (meaning this test can be used) for the duration of the COVID-19 declaration under Section 564(b)(1) of the Act, 21 U.S.C. section 360bbb-3(b)(1), unless the authorization is terminated or revoked sooner.    Influenza A by PCR NEGATIVE NEGATIVE Final   Influenza B by PCR NEGATIVE NEGATIVE Final    Comment: (NOTE) The Xpert Xpress SARS-CoV-2/FLU/RSV assay is intended as an aid in  the diagnosis of influenza from Nasopharyngeal swab specimens and  should not be used as a sole basis for treatment. Nasal washings and  aspirates are unacceptable for Xpert Xpress SARS-CoV-2/FLU/RSV  testing. Fact Sheet for Patients: https://www.moore.com/ Fact Sheet for Healthcare Providers: https://www.young.biz/ This test is not yet approved or cleared by the [place] FDA and  has been authorized for detection and/or diagnosis of SARS-CoV-2 by  FDA under an Emergency Use Authorization (EUA). This EUA will remain  in effect (meaning this test can be used) for the duration of the  Covid-19 declaration under Section 564(b)(1) of the Act, 21  U.S.C. section 360bbb-3(b)(1), unless the authorization is  terminated or revoked. Performed at [facility], [address] [place], [place] [address]   Respiratory Panel by RT PCR  (Flu A&B, Covid) - Nasopharyngeal Swab     Status: Abnormal   Collection Time: 06/14/19  6:25 PM   Specimen: Nasopharyngeal Swab  Result Value Ref Range Status   SARS Coronavirus 2 by RT PCR POSITIVE (A) NEGATIVE Final    Comment: RESULT CALLED TO, READ BACK BY AND VERIFIED WITH: B.BROOKS AT 2054 ON 06/14/19 BY N.THOMPSON (NOTE) SARS-CoV-2 target nucleic acids are DETECTED. SARS-CoV-2 RNA is generally detectable in upper respiratory specimens  during the acute phase of infection. Positive results are indicative of the presence of the identified virus, but do not rule out bacterial infection or co-infection with other pathogens not detected by the test. Clinical correlation with patient history and other diagnostic information is necessary to determine patient infection status. The expected result is Negative. Fact Sheet for Patients:  https://www.moore.com/ Fact Sheet for Healthcare Providers: https://www.young.biz/ This test is not yet approved or cleared by the [place] FDA and  has been authorized for detection and/or diagnosis of SARS-CoV-2 by FDA under an Emergency Use Authorization (EUA).  This EUA will remain in effect (meaning this test can be [place] ed) for the duration of  the COVID-19 declaration under Section 564(b)(1) of the Act, 21 U.S.C. section 360bbb-3(b)(1), unless the authorization is terminated or revoked sooner.    Influenza A by PCR NEGATIVE NEGATIVE Final   Influenza B by PCR NEGATIVE NEGATIVE Final    Comment: (NOTE) The Xpert Xpress SARS-CoV-2/FLU/RSV assay is intended as an aid in  the diagnosis of influenza from Nasopharyngeal swab specimens and  should not be used as a sole basis for treatment. Nasal washings and  aspirates are unacceptable for Xpert Xpress SARS-CoV-2/FLU/RSV  testing. Fact Sheet for Patients: https://www.moore.com/ Fact Sheet for Healthcare  Providers: https://www.young.biz/ This test is not yet approved or cleared by the [place] FDA and  has been authorized for detection and/or diagnosis of SARS-CoV-2 by  FDA under an Emergency Use Authorization (EUA). This EUA will remain  in effect (meaning this test can be used) for the duration of the  Covid-19 declaration under Section 564(b)(1) of the Act, 21  U.S.C. section 360bbb-3(b)(1), unless the authorization is  terminated or revoked. Performed at [facility], [address] [place], [place] [address]      Radiological Exams on Admission: DG Chest Portable 1 View  Result Date: 06/14/2019 CLINICAL DATA:  Dyspnea EXAM: PORTABLE CHEST 1 VIEW COMPARISON:  August 05, 2008 FINDINGS: The heart size and mediastinal contours are within normal limits. Hazy/patchy airspace opacity seen predominantly within both lower lungs at the periphery. The visualized skeletal structures are unremarkable. IMPRESSION: Multifocal patchy/ground-glass opacities seen predominantly within the lower lungs, likely consistent multifocal pneumonia. Electronically Signed   By: Heywood Bene.D.  On: 06/14/2019 19:07   Old chart reviewed Case discussed with edp cxr reviewed bilateral opacities   Assessment/Plan 64 yo male with acute hypoxic resp failure due to covid pna bilaterally Principal Problem:   Pneumonia due to COVID-19 virus- pt agreeable to decadron and remdisivir.  Vit c/zinc.  Lovenox.  Ck inflammatory markers, pending.  Repeat daily.  Supplemental 02 and wean as tolerates.  [facility]. Ck procal.  Active Problems:   DM (diabetes mellitus) ([place])- levemir and ssi   Acute respiratory failure due to COVID-19 [facility])- as above    DVT prophylaxis: lovenox Code Status: full Family Communication: none Disposition Plan: days Consults called: nonr Admission status: admission   Millie Shorb A MD [facility]  If 7PM-7AM,  please contact night-coverage [email or web address] Password [email or web address]  06/14/2019, 9:59 PM

## 2019-06-14 NOTE — ED Notes (Signed)
Attempted to call and give report to [facility] 1C nurse, they stated they were having difficulty transferring the call to the nurse and asked for this nurse contact info and stated they would call back immediately. No call back yet.

## 2019-06-14 NOTE — ED Triage Notes (Signed)
Patient presents with shortness of breath, fever, nausea, malaise and coughing. EMS noticed him taking quick shallow breaths to keep from coughing. EMS administered 300 ml of fluid and 4 mg  of Zofran. It is believed the patient may have Covid 19.      EMS vitals: 102/72 BP 112 HR 36 Resp Rate 92% O2 sat on non-re breather 30 CO2 98.8 Temp 202 CBG

## 2019-06-14 NOTE — ED Provider Notes (Signed)
[facility]-EMERGENCY DEPT Provider Note   CSN: [id number] Arrival date & time: 06/14/19  1741     History Chief Complaint  Patient presents with  . Cough  . Nausea  . Shortness of Breath  . Fever    Joseph Todd is a 64 y.o. male.  HPI   64 year old male with shortness of breath, generalized malaise, nausea.  Symptoms worsening over the past few days.  Recently admitted with likely vasovagal syncope.  He is feeling relatively okay at the time of discharge until symptoms began progressively worsening shortly after.  Feels very fatigued.  No vomiting or diarrhea.  He actually says he has not had a bowel movement in several days.  No specific urinary complaints.  No sick contacts that he is aware of.  No past medical history on file.  Patient Active Problem List   Diagnosis Date Noted  . Diabetes (HCC) 06/10/2019  . Acute kidney injury (HCC) 06/10/2019  . Syncope 06/09/2019    No family history on file.  Social History   Tobacco Use  . Smoking status: Not on file  Substance Use Topics  . Alcohol use: Not on file  . Drug use: Not on file    Home Medications Prior to Admission medications   Medication Sig Start Date End Date Taking? Authorizing Provider  cyanocobalamin (,VITAMIN B-12,) 1000 MCG/ML injection Inject 1,000 mcg into the muscle every 30 (thirty) days.  05/24/19  Yes [provider]  metFORMIN (GLUCOPHAGE) 500 MG tablet Take 500 mg by mouth daily with breakfast.   Yes [provider]  Sennosides (SENNA) 8.6 MG CAPS Take 8.6 mg by mouth daily. 06/10/19  Yes Dellia Cloud, MD  thiamine 100 MG tablet Take 100 mg by mouth 2 (two) times daily. 04/25/19  Yes [provider]  Vitamin D, Ergocalciferol, (DRISDOL) 1.25 MG (50000 UNIT) CAPS capsule Take 50,000 Units by mouth once a week. 04/25/19  Yes [provider]    Allergies    Patient has no known allergies.  Review of Systems   Review of Systems   All  systems reviewed and negative, other than as noted in HPI.  Physical Exam Updated Vital Signs BP 112/80   Pulse (!) 103   Temp 98.5 F (36.9 C)   Resp (!) 37   Ht 5\' 8"  (1.727 m)   Wt 80.7 kg   SpO2 (!) 89%   BMI 27.05 kg/m   Physical Exam Vitals and nursing note reviewed.  Constitutional:      Appearance: He is well-developed. He is ill-appearing.  HENT:     Head: Normocephalic and atraumatic.  Eyes:     General:        Right eye: No discharge.        Left eye: No discharge.     Conjunctiva/sclera: Conjunctivae normal.  Cardiovascular:     Rate and Rhythm: Regular rhythm. Tachycardia present.     Heart sounds: Normal heart sounds. No murmur. No friction rub. No gallop.   Pulmonary:     Effort: Pulmonary effort is normal. No respiratory distress.     Breath sounds: Normal breath sounds.     Comments: Tachypnea.  No adventitious breath sounds appreciated. Abdominal:     General: There is no distension.     Palpations: Abdomen is soft.     Tenderness: There is no abdominal tenderness.  Musculoskeletal:        General: No tenderness.     Cervical back: Neck supple.  Skin:    General: Skin is warm and dry.  Neurological:     Mental Status: He is alert and oriented to person, place, and time.     ED Results / Procedures / Treatments   Labs (all labs ordered are listed, but only abnormal results are displayed) Labs Reviewed  RESPIRATORY PANEL BY RT PCR (FLU A&B, COVID) - Abnormal; Notable for the following components:      Result Value   SARS Coronavirus 2 by RT PCR POSITIVE (*)    All other components within normal limits  CBC WITH DIFFERENTIAL/PLATELET - Abnormal; Notable for the following components:   Abs Immature Granulocytes 0.12 (*)    All other components within normal limits  COMPREHENSIVE METABOLIC PANEL - Abnormal; Notable for the following components:   Sodium 132 (*)    Chloride 96 (*)    Glucose, Bld 212 (*)    Calcium 8.2 (*)    Albumin 2.7 (*)     AST 48 (*)    ALT 56 (*)    Total Bilirubin 1.3 (*)    All other components within normal limits  C-REACTIVE PROTEIN - Abnormal; Notable for the following components:   CRP 23.7 (*)    All other components within normal limits  D-DIMER, QUANTITATIVE (NOT AT [facility]) - Abnormal; Notable for the following components:   D-Dimer, Quant 1.07 (*)    All other components within normal limits  FERRITIN - Abnormal; Notable for the following components:   Ferritin 2,004 (*)    All other components within normal limits  CBC WITH DIFFERENTIAL/PLATELET - Abnormal; Notable for the following components:   Abs Immature Granulocytes 0.09 (*)    All other components within normal limits  COMPREHENSIVE METABOLIC PANEL - Abnormal; Notable for the following components:   Sodium 133 (*)    Chloride 94 (*)    Glucose, Bld 229 (*)    Calcium 7.9 (*)    Albumin 2.5 (*)    AST 50 (*)    ALT 54 (*)    Anion gap 16 (*)    All other components within normal limits  GLUCOSE, CAPILLARY - Abnormal; Notable for the following components:   Glucose-Capillary 216 (*)    All other components within normal limits  GLUCOSE, CAPILLARY - Abnormal; Notable for the following components:   Glucose-Capillary 194 (*)    All other components within normal limits  GLUCOSE, CAPILLARY - Abnormal; Notable for the following components:   Glucose-Capillary 282 (*)    All other components within normal limits  CBC WITH DIFFERENTIAL/PLATELET - Abnormal; Notable for the following components:   Abs Immature Granulocytes 0.17 (*)    All other components within normal limits  COMPREHENSIVE METABOLIC PANEL - Abnormal; Notable for the following components:   Glucose, Bld 225 (*)    Calcium 8.5 (*)    Albumin 2.6 (*)    AST 44 (*)    ALT 48 (*)    All other components within normal limits  C-REACTIVE PROTEIN - Abnormal; Notable for the following components:   CRP 15.7 (*)    All other components within normal limits  FERRITIN -  Abnormal; Notable for the following components:   Ferritin 1,956 (*)    All other components within normal limits  D-DIMER, QUANTITATIVE (NOT AT [facility]) - Abnormal; Notable for the following components:   D-Dimer, Quant <0.00 (*)    All other components within normal limits  BRAIN NATRIURETIC PEPTIDE  PROCALCITONIN  ABO/RH  PREPARE FRESH  FROZEN PLASMA    EKG None  Radiology DG Chest Portable 1 View  Result Date: 06/14/2019 CLINICAL DATA:  Dyspnea EXAM: PORTABLE CHEST 1 VIEW COMPARISON:  August 05, 2008 FINDINGS: The heart size and mediastinal contours are within normal limits. Hazy/patchy airspace opacity seen predominantly within both lower lungs at the periphery. The visualized skeletal structures are unremarkable. IMPRESSION: Multifocal patchy/ground-glass opacities seen predominantly within the lower lungs, likely consistent multifocal pneumonia. Electronically Signed   By: Jonna Clark M.D.   On: 06/14/2019 19:07    Procedures Procedures (including critical care time)  CRITICAL CARE Performed by: Raeford Razor Total critical care time: 35 minutes Critical care time was exclusive of separately billable procedures and treating other patients. Critical care was necessary to treat or prevent imminent or life-threatening deterioration. Critical care was time spent personally by me on the following activities: development of treatment plan with patient and/or surrogate as well as nursing, discussions with consultants, evaluation of patient's response to treatment, examination of patient, obtaining history from patient or surrogate, ordering and performing treatments and interventions, ordering and review of laboratory studies, ordering and review of radiographic studies, pulse oximetry and re-evaluation of patient's condition.   Medications Ordered in ED Medications  0.9 %  sodium chloride infusion ( Intravenous New Bag/Given 06/14/19 1829)  HYDROmorphone (DILAUDID) injection 1 mg (1 mg  Intravenous Given 06/14/19 1826)  ondansetron (ZOFRAN) injection 4 mg (4 mg Intravenous Given 06/14/19 1827)    ED Course  I have reviewed the triage vital signs and the nursing notes.  Pertinent labs & imaging results that were available during my care of the patient were reviewed by me and considered in my medical decision making (see chart for details).    MDM Rules/Calculators/A&P                      64 year old male with several day history of progressively worsening fatigue, nausea and dyspnea.  High suspicion for COVID. Significant oxygen requirement of 5 to 6 L at rest.  Typical CXR findings. Confirmatory testing pending.  Will require admission given oxygen requirement.  Makayla Confer was evaluated in Emergency Department on 06/14/2019 for the symptoms described in the history of present illness. He was evaluated in the context of the global COVID-19 pandemic, which necessitated consideration that the patient might be at risk for infection with the SARS-CoV-2 virus that causes COVID-19. Institutional protocols and algorithms that pertain to the evaluation of patients at risk for COVID-19 are in a state of rapid change based on information released by regulatory bodies including the CDC and federal and state organizations. These policies and algorithms were followed during the patient's care in the ED.    Final Clinical Impression(s) / ED Diagnoses Final diagnoses:  COVID-19 virus infection    Rx / DC Orders ED Discharge Orders    None       Raeford Razor, MD 06/16/19 1303

## 2019-06-14 NOTE — ED Notes (Addendum)
Pt began experiencing some respiratory distress, O2 saturations would not come out of the 70s. Pt placed on both NRB at 12 LPM and Wilson at 10 LPM and saturations remained in the high 70s low 80s. Pt assisted to prone position and O2 saturations improved to high 90s and pt stated relief from respiratory distress. Respiratory and MD notified.

## 2019-06-15 ENCOUNTER — Encounter (HOSPITAL_COMMUNITY): Payer: Self-pay | Admitting: Family Medicine

## 2019-06-15 DIAGNOSIS — J1282 Pneumonia due to coronavirus disease 2019: Secondary | ICD-10-CM

## 2019-06-15 DIAGNOSIS — U071 COVID-19: Principal | ICD-10-CM

## 2019-06-15 LAB — CBC WITH DIFFERENTIAL/PLATELET
Abs Immature Granulocytes: 0.09 10*3/uL — ABNORMAL HIGH (ref 0.00–0.07)
Basophils Absolute: 0 10*3/uL (ref 0.0–0.1)
Basophils Relative: 1 %
Eosinophils Absolute: 0.1 10*3/uL (ref 0.0–0.5)
Eosinophils Relative: 1 %
HCT: 46.9 % (ref 39.0–52.0)
Hemoglobin: 14.8 g/dL (ref 13.0–17.0)
Immature Granulocytes: 1 %
Lymphocytes Relative: 16 %
Lymphs Abs: 1.3 10*3/uL (ref 0.7–4.0)
MCH: 28.7 pg (ref 26.0–34.0)
MCHC: 31.6 g/dL (ref 30.0–36.0)
MCV: 90.9 fL (ref 80.0–100.0)
Monocytes Absolute: 0.2 10*3/uL (ref 0.1–1.0)
Monocytes Relative: 2 %
Neutro Abs: 7 10*3/uL (ref 1.7–7.7)
Neutrophils Relative %: 79 %
Platelets: 339 10*3/uL (ref 150–400)
RBC: 5.16 MIL/uL (ref 4.22–5.81)
RDW: 12.7 % (ref 11.5–15.5)
WBC: 8.7 10*3/uL (ref 4.0–10.5)
nRBC: 0 % (ref 0.0–0.2)

## 2019-06-15 LAB — GLUCOSE, CAPILLARY
Glucose-Capillary: 194 mg/dL — ABNORMAL HIGH (ref 70–99)
Glucose-Capillary: 216 mg/dL — ABNORMAL HIGH (ref 70–99)
Glucose-Capillary: 282 mg/dL — ABNORMAL HIGH (ref 70–99)

## 2019-06-15 LAB — COMPREHENSIVE METABOLIC PANEL
ALT: 54 U/L — ABNORMAL HIGH (ref 0–44)
AST: 50 U/L — ABNORMAL HIGH (ref 15–41)
Albumin: 2.5 g/dL — ABNORMAL LOW (ref 3.5–5.0)
Alkaline Phosphatase: 61 U/L (ref 38–126)
Anion gap: 16 — ABNORMAL HIGH (ref 5–15)
BUN: 15 mg/dL (ref 8–23)
CO2: 23 mmol/L (ref 22–32)
Calcium: 7.9 mg/dL — ABNORMAL LOW (ref 8.9–10.3)
Chloride: 94 mmol/L — ABNORMAL LOW (ref 98–111)
Creatinine, Ser: 1.19 mg/dL (ref 0.61–1.24)
GFR calc Af Amer: >60 mL/min (ref >60)
GFR calc non Af Amer: >60 mL/min (ref >60)
Glucose, Bld: 229 mg/dL — ABNORMAL HIGH (ref 70–99)
Potassium: 5 mmol/L (ref 3.5–5.1)
Sodium: 133 mmol/L — ABNORMAL LOW (ref 135–145)
Total Bilirubin: 1.2 mg/dL (ref 0.3–1.2)
Total Protein: 7.3 g/dL (ref 6.5–8.1)

## 2019-06-15 LAB — FERRITIN: Ferritin: 2004 ng/mL — ABNORMAL HIGH (ref 24–336)

## 2019-06-15 LAB — D-DIMER, QUANTITATIVE: D-Dimer, Quant: 1.07 ug/mL-FEU — ABNORMAL HIGH (ref 0.00–0.50)

## 2019-06-15 LAB — C-REACTIVE PROTEIN: CRP: 23.7 mg/dL — ABNORMAL HIGH (ref <1.0)

## 2019-06-15 LAB — PROCALCITONIN: Procalcitonin: 0.57 ng/mL

## 2019-06-15 LAB — ABO/RH: ABO/RH(D): O POS

## 2019-06-15 MED ORDER — GUAIFENESIN-DM 100-10 MG/5ML PO SYRP
5.0000 mL | ORAL_SOLUTION | ORAL | Status: DC | PRN
  Administered 2019-06-15 – 2019-06-19 (×7): 5 mL via ORAL
  Filled 2019-06-15 (×7): qty 10

## 2019-06-15 MED ORDER — METHYLPREDNISOLONE SODIUM SUCC 40 MG IJ SOLR
0.5000 mg/kg | Freq: Two times a day (BID) | INTRAMUSCULAR | Status: DC
  Administered 2019-06-15 – 2019-06-17 (×5): 40 mg via INTRAVENOUS
  Filled 2019-06-15 (×5): qty 1

## 2019-06-15 MED ORDER — TOCILIZUMAB 400 MG/20ML IV SOLN
8.0000 mg/kg | Freq: Once | INTRAVENOUS | Status: AC
  Administered 2019-06-15: 640 mg via INTRAVENOUS
  Filled 2019-06-15: qty 32

## 2019-06-15 MED ORDER — IPRATROPIUM-ALBUTEROL 20-100 MCG/ACT IN AERS
1.0000 | INHALATION_SPRAY | Freq: Four times a day (QID) | RESPIRATORY_TRACT | Status: DC
  Administered 2019-06-15 – 2019-06-25 (×35): 1 via RESPIRATORY_TRACT
  Filled 2019-06-15: qty 4

## 2019-06-15 NOTE — Progress Notes (Signed)
Page to MD:   Pt Mews is red. RR 40, HR 104. Pt sats 90% on 10LHF, down from 15LHF during night. Respirations very shallow, diminished, but no apparent distress. Pain level of 9 in shoulders bilaterally since fall. PRN tylenol given.

## 2019-06-15 NOTE — Progress Notes (Addendum)
Inpatient Diabetes Program Recommendations  AACE/ADA: New Consensus Statement on Inpatient Glycemic Control (2015)  Target Ranges:  Prepandial:   less than 140 mg/dL      Peak postprandial:   less than 180 mg/dL (1-2 hours)      Critically ill patients:  140 - 180 mg/dL   Lab Results  Component Value Date   GLUCAP 194 (H) 06/15/2019   HGBA1C 11.9 (H) 06/09/2019    Review of Glycemic Control  Diabetes history: DM 2 Outpatient Diabetes medications: metformin 500 mg Daily Current orders for Inpatient glycemic control:  Levemir 6 units bid Novolog 0-9 units tid + hs  Solumedrol 40 mg Q12 hours  Inpatient Diabetes Program Recommendations:     Agree with insulin orders at this time.  Consult elevated A1c of 11.9% on 1/31. Pt proning while awake also on 10 L HFNC. Will follow pt and speak to pt prior to d/c.  Note DM Coordinator working remotely over the weekend, but is available via pager/phone.  Thanks,  Christena Deem RN, MSN, BC-ADM Inpatient Diabetes Coordinator Team Pager [phone] (8a-5p)

## 2019-06-15 NOTE — Plan of Care (Signed)
Plan of Care reviewed. 

## 2019-06-15 NOTE — Evaluation (Signed)
Clinical/Bedside Swallow Evaluation Patient Details  Name: Joseph Todd MRN: [id number] Date of Birth: 09-30-1955  Today's Date: 06/15/2019 Time: SLP Start Time (ACUTE ONLY): 1548 SLP Stop Time (ACUTE ONLY): 1556 SLP Time Calculation (min) (ACUTE ONLY): 8 min  Past Medical History:  Past Medical History:  Diagnosis Date  . Carpal tunnel syndrome on right   . DM (diabetes mellitus) (HCC)   . EKG abnormalities   . Neck pain   . Syncope and collapse    Past Surgical History: History reviewed. No pertinent surgical history. HPI:  Joseph Todd is a 64 y.o. male with medical history significant of DM comes in with one week of weakness, worsening sob and nausea. Per chart pt hospitalized last week with syncope, covid was neg at that time.  Symptoms progressed. CXR Multifocal patchy/ground-glass opacities seen predominantly within the lower lungs, likely consistent multifocal pneumonia   Assessment / Plan / Recommendation Clinical Impression  Pt exhibited cough when SLP arrived. He reports having a tickle in his throat with eating beginnig with the onset of Covid symptoms. RN has not witnessed s/s aspiraiton. Pt's dentition, motor strength orally are within norma limits. SpO2 on arrival 88%. Consumed approximately 2 oz thin water via strong with one subtle throat clear. Work of breathing was not significantly changed. Discussed relationship respiratory deficits can have on swallow and provided verbal education to consume po's safely and efficiently. Recommend he continue regular/thin, pills with thin. No follow up needed.   SLP Visit Diagnosis: Dysphagia, unspecified (R13.10)    Aspiration Risk  Mild aspiration risk    Diet Recommendation Regular;Thin liquid   Liquid Administration via: Cup;Straw Medication Administration: Whole meds with liquid Supervision: Patient able to self feed Compensations: Slow rate;Small sips/bites Postural Changes: Seated upright at 90 degrees    Other   Recommendations Oral Care Recommendations: Oral care BID   Follow up Recommendations None      Frequency and Duration            Prognosis        Swallow Study   General HPI: Joseph Todd is a 64 y.o. male with medical history significant of DM comes in with one week of weakness, worsening sob and nausea. Per chart pt hospitalized last week with syncope, covid was neg at that time.  Symptoms progressed. CXR Multifocal patchy/ground-glass opacities seen predominantly within the lower lungs, likely consistent multifocal pneumonia Type of Study: Bedside Swallow Evaluation Previous Swallow Assessment: (none) Diet Prior to this Study: Regular;Thin liquids Temperature Spikes Noted: No Respiratory Status: Other (comment)(HFNC 8 L) History of Recent Intubation: No Behavior/Cognition: Alert;Cooperative;Pleasant mood Oral Cavity Assessment: Within Functional Limits Oral Care Completed by SLP: No Oral Cavity - Dentition: Adequate natural dentition Vision: Functional for self-feeding Self-Feeding Abilities: Able to feed self Patient Positioning: Upright in bed Baseline Vocal Quality: Normal Volitional Cough: Strong Volitional Swallow: Able to elicit    Oral/Motor/Sensory Function Overall Oral Motor/Sensory Function: Within functional limits   Ice Chips Ice chips: Not tested   Thin Liquid Thin Liquid: Within functional limits Presentation: Straw    Nectar Thick Nectar Thick Liquid: Not tested   Honey Thick Honey Thick Liquid: Not tested   Puree Puree: Not tested   Solid     Solid: Within functional limits      Royce Macadamia 06/15/2019,4:06 PM   Breck Coons Lonell Face.Ed Nurse, children's [phone] Office [phone]

## 2019-06-15 NOTE — Plan of Care (Signed)
Pt continues to be tachypneic and dyspneic with minimal exertion. Maintained on O2 at 10-12 HFNC. Proned >6hrs overnight. Desaturates to the 70s quickly when lying on his back, slow recovery unless proned. Standby assist with ADLs. Noted to be coughing after solid food intake (jello), pt said this has been ongoing for about a week every time he eats, no issues noted with fluids.  SLP consult ordered, in the meantime pt advised to have small sips of water only until assessed. Will continue to monitor.   Problem: Education: Goal: Knowledge of risk factors and measures for prevention of condition will improve 06/15/2019 0527 by Roetta Sessions, RN Outcome: Progressing 06/15/2019 0142 by Roetta Sessions, RN Outcome: Progressing   Problem: Coping: Goal: Psychosocial and spiritual needs will be supported 06/15/2019 0527 by Roetta Sessions, RN Outcome: Progressing 06/15/2019 0142 by Roetta Sessions, RN Outcome: Progressing   Problem: Respiratory: Goal: Will maintain a patent airway 06/15/2019 0527 by Roetta Sessions, RN Outcome: Progressing 06/15/2019 0142 by Roetta Sessions, RN Outcome: Progressing Goal: Complications related to the disease process, condition or treatment will be avoided or minimized 06/15/2019 0527 by Roetta Sessions, RN Outcome: Progressing 06/15/2019 0142 by Roetta Sessions, RN Outcome: Progressing

## 2019-06-15 NOTE — Progress Notes (Signed)
PROGRESS NOTE    Joseph Todd  OXB:[id number] DOB: 1956/01/26 DOA: 06/14/2019 PCP: Patient, No Pcp Per    Brief Narrative:   Joseph Todd is a 64 year old African-American male with past medical history remarkable for type 2 diabetes mellitus who presented to the ED with 1 week history of weakness, shortness of breath and nausea.  Patient reports dyspnea has been progressive with subjective fevers.  Denies vomiting/diarrhea, no chest pain, no abdominal pain.  Recently hospitalized a week ago with syncope, Covid-19 PCR was negative at that time.  In the ED, patient was noted to be hypoxic requiring nonrebreather to maintain adequate oxygenation.  ED PA referred for admission for Covid pneumonia with hypoxia.   Assessment & Plan:   Principal Problem:   Pneumonia due to COVID-19 virus Active Problems:   DM (diabetes mellitus) (HCC)   Acute respiratory failure due to COVID-19 [facility])   Acute hypoxic respiratory failure secondary to acute Covid-19 viral pneumonia during the ongoing 2020 Covid 19 Pandemic - POA Patient presenting with progressive shortness of breath, nausea over the past week.  Chest x-ray with multifocal patchy/groundglass opacities predominant within the lower lungs, consistent with multifocal pneumonia. --D-dimer 1.07 --CRP 23.7 --Ferritin 2004 --Continue remdesivir, plan 5-day course --Change Decadron to Solu-Medrol 40mg  IV BID --Start Actemra --Continue supplemental oxygen, titrate to maintain SPO2 greater than 92%; currently on 10 L HFNC --Continue supportive care with vitamin C, zinc, Tylenol, antitussives, combivent MDI --Follow CBC, CMP, D-dimer, ferritin, and CRP daily --Continue prone positioning when able while awake --Continue airborne/contact isolation precautions  Type 2 diabetes mellitus; poorly controlled Hemoglobin A1c 11.9 on 06/09/2019, not well controlled.  On Metformin 500 mg p.o. daily at home. --Lantus 6 units subcutaneously twice  daily --Insulin sliding scale for further coverage; closely monitor given need of IV steroids --Diabetic educator consult   DVT prophylaxis: Lovenox Code Status: Full code Family Communication: Discussed with patient extensively at bedside Disposition Plan: To be determined, pending PT/OT evaluation, from home, currently unsafe discharge secondary to high FiO2 requirements   Consultants:   None  Procedures:   None  Covid-19 treatment:   Remdesivir 2/5>>  Actemra 2/6  Decadron 2/5 - 2/6  Solu-Medrol 2/6>>   Subjective: Patient seen and examined at bedside, lying in the prone position.  States since position change, breathing/effort improved.  Continues on high flow nasal cannula 10 L/min.  No other specific complaints at this time.  Denies headache, no fever/chills/night sweats, no vomiting/diarrhea, no chest pain, palpitations, no abdominal pain.  No acute events overnight per nursing staff.  Objective: Vitals:   06/15/19 0226 06/15/19 0400 06/15/19 0756 06/15/19 0933  BP: 114/75 91/67 110/79   Pulse: 96 94 (!) 104 (!) 112  Resp: (!) 29 (!) 25 (!) 38 19  Temp: 98 F (36.7 C) 98.7 F (37.1 C) 98.2 F (36.8 C)   TempSrc: Oral Oral Oral   SpO2: 92% 97% 90%   Weight:      Height:        Intake/Output Summary (Last 24 hours) at 06/15/2019 1001 Last data filed at 06/15/2019 0407 Gross per 24 hour  Intake 540 ml  Output 725 ml  Net -185 ml   Filed Weights   06/14/19 1801 06/15/19 0124  Weight: 80.7 kg 79.9 kg    Examination:  General exam: Appears calm and comfortable  Respiratory system: Clear to auscultation.  Slightly increased respiratory effort, on 10 L high flow nasal cannula Cardiovascular system: S1 & S2 heard, RRR. No JVD,  murmurs, rubs, gallops or clicks. No pedal edema. Gastrointestinal system: Abdomen is nondistended, soft and nontender. No organomegaly or masses felt. Normal bowel sounds heard. Central nervous system: Alert and oriented. No focal  neurological deficits. Extremities: Symmetric 5 x 5 power. Skin: No rashes, lesions or ulcers Psychiatry: Judgement and insight appear normal. Mood & affect appropriate.     Data Reviewed: I have personally reviewed following labs and imaging studies  CBC: Recent Labs  Lab 06/09/19 0459 06/10/19 0333 06/14/19 1825 06/15/19 0450  WBC 6.4 3.8* 8.3 8.7  NEUTROABS  --   --  6.6 7.0  HGB 16.5 16.9 15.0 14.8  HCT 51.2 51.6 44.9 46.9  MCV 90.9 90.1 88.2 90.9  PLT 211 224 274 339   Basic Metabolic Panel: Recent Labs  Lab 06/09/19 0459 06/10/19 0333 06/14/19 1825 06/15/19 0450  NA 130* 132* 132* 133*  K 4.1 4.6 4.2 5.0  CL 94* 94* 96* 94*  CO2 25 27 25 23   GLUCOSE 290* 195* 212* 229*  BUN 17 12 15 15   CREATININE 1.42* 1.28* 1.04 1.19  CALCIUM 8.5* 8.9 8.2* 7.9*   GFR: Estimated Creatinine Clearance: 63.1 mL/min (by C-G formula based on SCr of 1.19 mg/dL). Liver Function Tests: Recent Labs  Lab 06/14/19 1825 06/15/19 0450  AST 48* 50*  ALT 56* 54*  ALKPHOS 58 61  BILITOT 1.3* 1.2  PROT 7.1 7.3  ALBUMIN 2.7* 2.5*   No results for input(s): LIPASE, AMYLASE in the last 168 hours. No results for input(s): AMMONIA in the last 168 hours. Coagulation Profile: No results for input(s): INR, PROTIME in the last 168 hours. Cardiac Enzymes: No results for input(s): CKTOTAL, CKMB, CKMBINDEX, TROPONINI in the last 168 hours. BNP (last 3 results) No results for input(s): PROBNP in the last 8760 hours. HbA1C: No results for input(s): HGBA1C in the last 72 hours. CBG: Recent Labs  Lab 06/09/19 1700 06/09/19 2252 06/10/19 0638 06/15/19 0036 06/15/19 0800  GLUCAP 169* 206* 174* 216* 194*   Lipid Profile: No results for input(s): CHOL, HDL, LDLCALC, TRIG, CHOLHDL, LDLDIRECT in the last 72 hours. Thyroid Function Tests: No results for input(s): TSH, T4TOTAL, FREET4, T3FREE, THYROIDAB in the last 72 hours. Anemia Panel: Recent Labs    06/15/19 0450  FERRITIN 2,004*    Sepsis Labs: Recent Labs  Lab 06/15/19 0450  PROCALCITON 0.57    Recent Results (from the past 240 hour(s))  Respiratory Panel by RT PCR (Flu A&B, Covid) - Nasopharyngeal Swab     Status: None   Collection Time: 06/09/19  5:37 AM   Specimen: Nasopharyngeal Swab  Result Value Ref Range Status   SARS Coronavirus 2 by RT PCR NEGATIVE NEGATIVE Final    Comment: (NOTE) SARS-CoV-2 target nucleic acids are NOT DETECTED. The SARS-CoV-2 RNA is generally detectable in upper respiratoy specimens during the acute phase of infection. The lowest concentration of SARS-CoV-2 viral copies this assay can detect is 131 copies/mL. A negative result does not preclude SARS-Cov-2 infection and should not be used as the sole basis for treatment or other patient management decisions. A negative result may occur with  improper specimen collection/handling, submission of specimen other than nasopharyngeal swab, presence of viral mutation(s) within the areas targeted by this assay, and inadequate number of viral copies (<131 copies/mL). A negative result must be combined with clinical observations, patient history, and epidemiological information. The expected result is Negative. Fact Sheet for Patients:  08/13/19 Fact Sheet for Healthcare Providers:  06/11/19 This test is not  yet ap proved or cleared by the [place] and  has been authorized for detection and/or diagnosis of SARS-CoV-2 by FDA under an Emergency Use Authorization (EUA). This EUA will remain  in effect (meaning this test can be used) for the duration of the COVID-19 declaration under Section 564(b)(1) of the Act, 21 U.S.C. section 360bbb-3(b)(1), unless the authorization is terminated or revoked sooner.    Influenza A by PCR NEGATIVE NEGATIVE Final   Influenza B by PCR NEGATIVE NEGATIVE Final    Comment: (NOTE) The Xpert Xpress SARS-CoV-2/FLU/RSV assay is  intended as an aid in  the diagnosis of influenza from Nasopharyngeal swab specimens and  should not be used as a sole basis for treatment. Nasal washings and  aspirates are unacceptable for Xpert Xpress SARS-CoV-2/FLU/RSV  testing. Fact Sheet for Patients: PinkCheek.be Fact Sheet for Healthcare Providers: GravelBags.it This test is not yet approved or cleared by the [place] FDA and  has been authorized for detection and/or diagnosis of SARS-CoV-2 by  FDA under an Emergency Use Authorization (EUA). This EUA will remain  in effect (meaning this test can be used) for the duration of the  Covid-19 declaration under Section 564(b)(1) of the Act, 21  U.S.C. section 360bbb-3(b)(1), unless the authorization is  terminated or revoked. Performed at [facility], [place] [address] [place], [place] [address]   Respiratory Panel by RT PCR (Flu A&B, Covid) - Nasopharyngeal Swab     Status: Abnormal   Collection Time: 06/14/19  6:25 PM   Specimen: Nasopharyngeal Swab  Result Value Ref Range Status   SARS Coronavirus 2 by RT PCR POSITIVE (A) NEGATIVE Final    Comment: RESULT CALLED TO, READ BACK BY AND VERIFIED WITH: B.BROOKS AT 2054 ON 06/14/19 BY N.THOMPSON (NOTE) SARS-CoV-2 target nucleic acids are DETECTED. SARS-CoV-2 RNA is generally detectable in upper respiratory specimens  during the acute phase of infection. Positive results are indicative of the presence of the identified virus, but do not rule out bacterial infection or co-infection with other pathogens not detected by the test. Clinical correlation with patient history and other diagnostic information is necessary to determine patient infection status. The expected result is Negative. Fact Sheet for Patients:  PinkCheek.be Fact Sheet for Healthcare Providers: GravelBags.it This test is not yet  approved or cleared by the [place] FDA and  has been authorized for detection and/or diagnosis of SARS-CoV-2 by FDA under an Emergency Use Authorization (EUA).  This EUA will remain in effect (meaning this test can be [place] ed) for the duration of  the COVID-19 declaration under Section 564(b)(1) of the Act, 21 U.S.C. section 360bbb-3(b)(1), unless the authorization is terminated or revoked sooner.    Influenza A by PCR NEGATIVE NEGATIVE Final   Influenza B by PCR NEGATIVE NEGATIVE Final    Comment: (NOTE) The Xpert Xpress SARS-CoV-2/FLU/RSV assay is intended as an aid in  the diagnosis of influenza from Nasopharyngeal swab specimens and  should not be used as a sole basis for treatment. Nasal washings and  aspirates are unacceptable for Xpert Xpress SARS-CoV-2/FLU/RSV  testing. Fact Sheet for Patients: PinkCheek.be Fact Sheet for Healthcare Providers: GravelBags.it This test is not yet approved or cleared by the [place] FDA and  has been authorized for detection and/or diagnosis of SARS-CoV-2 by  FDA under an Emergency Use Authorization (EUA). This EUA will remain  in effect (meaning this test can be used) for the duration of the  Covid-19 declaration under Section 564(b)(1) of the  Act, 21  U.S.C. section 360bbb-3(b)(1), unless the authorization is  terminated or revoked. Performed at [facility], [address] [place], [place] [address]          Radiology Studies: DG Chest Portable 1 View  Result Date: 06/14/2019 CLINICAL DATA:  Dyspnea EXAM: PORTABLE CHEST 1 VIEW COMPARISON:  August 05, 2008 FINDINGS: The heart size and mediastinal contours are within normal limits. Hazy/patchy airspace opacity seen predominantly within both lower lungs at the periphery. The visualized skeletal structures are unremarkable. IMPRESSION: Multifocal patchy/ground-glass opacities seen predominantly within the  lower lungs, likely consistent multifocal pneumonia. Electronically Signed   By: Jonna Clark M.D.   On: 06/14/2019 19:07        Scheduled Meds: . vitamin C  500 mg Oral Daily  . enoxaparin (LOVENOX) injection  40 mg Subcutaneous Q24H  . insulin aspart  0-5 Units Subcutaneous QHS  . insulin aspart  0-9 Units Subcutaneous TID WC  . insulin detemir  0.075 Units/kg Subcutaneous BID  . Ipratropium-Albuterol  1 puff Inhalation Q6H  . methylPREDNISolone (SOLU-MEDROL) injection  0.5 mg/kg Intravenous Q12H  . sodium chloride flush  3 mL Intravenous Q12H  . zinc sulfate  220 mg Oral Daily   Continuous Infusions: . sodium chloride 100 mL/hr at 06/15/19 0916  . sodium chloride    . remdesivir 100 mg in NS 100 mL    . tocilizumab (ACTEMRA) - non-COVID treatment 640 mg (06/15/19 0917)     LOS: 1 day    Time spent: 39 minutes spent on chart review, discussion with nursing staff, consultants, updating family and interview/physical exam; more than 50% of that time was spent in counseling and/or coordination of care.    Alvira Philips [place], DO [facility] Available via Epic secure chat 7am-7pm After these hours, please refer to coverage provider listed on [email or web address] 06/15/2019, 10:01 AM

## 2019-06-15 NOTE — Progress Notes (Signed)
   Vital Signs MEWS/VS Documentation      06/15/2019 0023 06/15/2019 0027 06/15/2019 0125 06/15/2019 0226   MEWS Score:  3  2  1  2    MEWS Score Color:  Yellow  Yellow  Green  Yellow   Resp:  --  (!) 35  --  (!) 29   Pulse:  --  100  --  96   BP:  --  117/78  --  114/75   Temp:  --  98.7 F (37.1 C)  --  98 F (36.7 C)   O2 Device:  --  HFNC  --  HFNC   O2 Flow Rate (L/min):  --  10 L/min  --  10 L/min   Level of Consciousness:  --  Alert  Alert  Alert     Yellow MEWS score due to tachypnea and occasional tachycardia. MD and RRT nurse informed. Will monitor accordingly. 06/15/2019,2:31 AM

## 2019-06-15 NOTE — Progress Notes (Addendum)
Pt admitted from [facility] via [place].Vitals stable on 10L HFNC. Received pt prone, quickly desaturates down to 70s when lying on his back. Pt alert and oriented.  Admission questions and assessment done. IV fluids started. Oriented to unit setup and routine. On call MD paged, telemetry monitoring started. Will monitor.

## 2019-06-16 LAB — COMPREHENSIVE METABOLIC PANEL
ALT: 48 U/L — ABNORMAL HIGH (ref 0–44)
AST: 44 U/L — ABNORMAL HIGH (ref 15–41)
Albumin: 2.6 g/dL — ABNORMAL LOW (ref 3.5–5.0)
Alkaline Phosphatase: 67 U/L (ref 38–126)
Anion gap: 9 (ref 5–15)
BUN: 22 mg/dL (ref 8–23)
CO2: 27 mmol/L (ref 22–32)
Calcium: 8.5 mg/dL — ABNORMAL LOW (ref 8.9–10.3)
Chloride: 102 mmol/L (ref 98–111)
Creatinine, Ser: 1.12 mg/dL (ref 0.61–1.24)
GFR calc Af Amer: >60 mL/min (ref >60)
GFR calc non Af Amer: >60 mL/min (ref >60)
Glucose, Bld: 225 mg/dL — ABNORMAL HIGH (ref 70–99)
Potassium: 5 mmol/L (ref 3.5–5.1)
Sodium: 138 mmol/L (ref 135–145)
Total Bilirubin: 0.9 mg/dL (ref 0.3–1.2)
Total Protein: 6.9 g/dL (ref 6.5–8.1)

## 2019-06-16 LAB — CBC WITH DIFFERENTIAL/PLATELET
Abs Immature Granulocytes: 0.17 10*3/uL — ABNORMAL HIGH (ref 0.00–0.07)
Basophils Absolute: 0.1 10*3/uL (ref 0.0–0.1)
Basophils Relative: 1 %
Eosinophils Absolute: 0 10*3/uL (ref 0.0–0.5)
Eosinophils Relative: 0 %
HCT: 45.7 % (ref 39.0–52.0)
Hemoglobin: 14.6 g/dL (ref 13.0–17.0)
Immature Granulocytes: 2 %
Lymphocytes Relative: 18 %
Lymphs Abs: 1.5 10*3/uL (ref 0.7–4.0)
MCH: 28.9 pg (ref 26.0–34.0)
MCHC: 31.9 g/dL (ref 30.0–36.0)
MCV: 90.3 fL (ref 80.0–100.0)
Monocytes Absolute: 0.3 10*3/uL (ref 0.1–1.0)
Monocytes Relative: 4 %
Neutro Abs: 6.2 10*3/uL (ref 1.7–7.7)
Neutrophils Relative %: 75 %
Platelets: 368 10*3/uL (ref 150–400)
RBC: 5.06 MIL/uL (ref 4.22–5.81)
RDW: 13.2 % (ref 11.5–15.5)
WBC: 8.3 10*3/uL (ref 4.0–10.5)
nRBC: 0 % (ref 0.0–0.2)

## 2019-06-16 LAB — D-DIMER, QUANTITATIVE: D-Dimer, Quant: <0 ug/mL-FEU — ABNORMAL LOW (ref 0.00–0.50)

## 2019-06-16 LAB — FERRITIN: Ferritin: 1956 ng/mL — ABNORMAL HIGH (ref 24–336)

## 2019-06-16 LAB — C-REACTIVE PROTEIN: CRP: 15.7 mg/dL — ABNORMAL HIGH (ref <1.0)

## 2019-06-16 MED ORDER — SODIUM CHLORIDE 0.9% IV SOLUTION: Freq: Once | INTRAVENOUS | Status: AC

## 2019-06-16 MED ORDER — SENNOSIDES-DOCUSATE SODIUM 8.6-50 MG PO TABS
2.0000 | ORAL_TABLET | Freq: Two times a day (BID) | ORAL | Status: DC
  Administered 2019-06-16 – 2019-06-25 (×14): 2 via ORAL
  Filled 2019-06-16 (×19): qty 2

## 2019-06-16 MED ORDER — POLYETHYLENE GLYCOL 3350 17 G PO PACK: 17.0000 g | PACK | Freq: Every day | ORAL | Status: DC | PRN

## 2019-06-16 MED ORDER — TOCILIZUMAB 400 MG/20ML IV SOLN
8.0000 mg/kg | Freq: Once | INTRAVENOUS | Status: AC
  Administered 2019-06-16: 640 mg via INTRAVENOUS
  Filled 2019-06-16: qty 32

## 2019-06-16 MED ORDER — INSULIN DETEMIR 100 UNIT/ML ~~LOC~~ SOLN
10.0000 [IU] | Freq: Two times a day (BID) | SUBCUTANEOUS | Status: DC
  Administered 2019-06-16 – 2019-06-17 (×4): 10 [IU] via SUBCUTANEOUS
  Filled 2019-06-16 (×6): qty 0.1

## 2019-06-16 NOTE — Plan of Care (Signed)
Plan of Care reviewed. 

## 2019-06-16 NOTE — Progress Notes (Signed)
Occupational Therapy Evaluation Patient Details Name: Joseph Todd MRN: [id number] DOB: 1955-12-29 Today's Date: 06/16/2019    History of Present Illness Taiki Buckwalter is a 64 year old African-American male with past medical history remarkable for type 2 diabetes mellitus who presented to the ED with 1 week history of weakness, shortness of breath and nausea.  Patient reports dyspnea has been progressive with subjective fevers.  Denies vomiting/diarrhea, no chest pain, no abdominal pain. Recently hospitalized a week ago with syncope, Covid-19 PCR was negative at that time. In the ED, patient was noted to be hypoxic requiring nonrebreather to maintain adequate oxygenation.  ED PA referred for admission for Covid pneumonia with hypoxia.   Clinical Impression   PTA pt resided with wife, independent in all ADL, IADL, and mobility tasks. Pt still drives and works in Engineer, technical sales. Pt does not ambulate with an assistive device and reports 1 fall in the last 6 months where he passed out while using the bathroom. Pt does not use oxygen at home and is currently on 10L HFNC. Pt currently independent to min guard for self-care and functional transfer tasks. Pt able to ambulate to/from bathroom with min guard and without use of assistive device. Noted 0 instances of loss of balance, however pt slightly unsteady on feet. Pt completed toileting task, able to stand ~1 min at the sink to wash hands. Pt tolerated standing and ambulating additional ~2 min around room. SpO2 dropped to 85% on 10L HFNC following activity. Noted quick return back to 90s following seated rest break. RR fluctuated between 20s - 51. Pt reported min shortness of breath throughout. Educated pt on breathing strategies with good understanding and follow through. Pt demonstrates decreased strength, endurance, balance, standing tolerance, and activity tolerance impacting ability to complete self-care and functional transfer tasks. Recommend skilled OT services to  address above deficits in order to promote function and prevent further decline.     Follow Up Recommendations  No OT follow up;Supervision - Intermittent    Equipment Recommendations  3 in 1 bedside commode(for use in shower)    Recommendations for Other Services       Precautions / Restrictions Precautions Precautions: Fall;Other (comment) Precaution Comments: Monitor SpO2 and RR Restrictions Weight Bearing Restrictions: No      Mobility Bed Mobility Overal bed mobility: Needs Assistance Bed Mobility: Supine to Sit     Supine to sit: Supervision;HOB elevated     General bed mobility comments: Use of bedrail. Supervision for line management and safety  Transfers Overall transfer level: Needs assistance Equipment used: None Transfers: Sit to/from Stand Sit to Stand: Supervision         General transfer comment: Supervision to ensure safety and for line management.     Balance Overall balance assessment: Mild deficits observed, not formally tested                                         ADL either performed or assessed with clinical judgement   ADL Overall ADL's : Needs assistance/impaired Eating/Feeding: Independent;Sitting   Grooming: Supervision/safety;Standing   Upper Body Bathing: Supervision/ safety;Standing   Lower Body Bathing: Supervison/ safety;Sit to/from stand   Upper Body Dressing : Set up;Sitting   Lower Body Dressing: Supervision/safety;Sit to/from stand   Toilet Transfer: Supervision/safety;Ambulation;Regular Toilet;Grab bars   Toileting- Clothing Manipulation and Hygiene: Supervision/safety;Sit to/from stand       Functional mobility during ADLs: Min  guard General ADL Comments: Pt able to ambulate to/from bathroom without an assistive device. Noted 0 instances of LOB.      Vision Baseline Vision/History: Wears glasses Wears Glasses: Reading only       Perception     Praxis      Pertinent Vitals/Pain Pain  Assessment: 0-10 Pain Score: 7  Pain Location: left shoulder Pain Descriptors / Indicators: Constant Pain Intervention(s): Limited activity within patient's tolerance;Repositioned;Monitored during session     Hand Dominance Right   Extremity/Trunk Assessment Upper Extremity Assessment Upper Extremity Assessment: Generalized weakness   Lower Extremity Assessment Lower Extremity Assessment: Defer to PT evaluation       Communication Communication Communication: No difficulties   Cognition Arousal/Alertness: Awake/alert Behavior During Therapy: WFL for tasks assessed/performed Overall Cognitive Status: Within Functional Limits for tasks assessed                                     General Comments  Pt on 10L HFNC with SpO2 93% at rest. SpO2 dropped to 85% following activity with pt requiring less than 1 minute to return back to 90s. RR  fluctuated between 20s - 51.     Exercises     Shoulder Instructions      Home Living Family/patient expects to be discharged to:: Private residence Living Arrangements: Spouse/significant other Available Help at Discharge: Family Type of Home: House Home Access: Stairs to enter [employer] of Steps: 2   Home Layout: Two level;Able to live on main level with bedroom/bathroom     Bathroom Shower/Tub: Producer, television/film/video: Handicapped height     Home Equipment: Hand held shower head          Prior Functioning/Environment Level of Independence: Independent        Comments: Pt independent in ADLs, IADLs, and mobility. Pt does not ambulate with an assistive device and reports 1 fall in the last 6 months. 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 Pt does not use oxygen at home.        OT Problem List: Decreased strength;Decreased activity tolerance;Impaired balance (sitting and/or standing);Decreased knowledge of use of DME or AE;Cardiopulmonary status limiting activity      OT  Treatment/Interventions: Self-care/ADL training;Therapeutic exercise;Neuromuscular education;Energy conservation;DME and/or AE instruction;Therapeutic activities;Patient/family education;Balance training    OT Goals(Current goals can be found in the care plan section) Acute Rehab OT Goals Patient Stated Goal: to go home Time For Goal Achievement: 06/30/19 Potential to Achieve Goals: Good ADL Goals Pt Will Perform Grooming: Independently;standing Pt Will Perform Lower Body Bathing: Independently;sit to/from stand Pt Will Perform Lower Body Dressing: Independently;sit to/from stand Pt Will Transfer to Toilet: Independently;ambulating;regular height toilet Pt Will Perform Toileting - Clothing Manipulation and hygiene: Independently;sit to/from stand Additional ADL Goal #1: Pt to tolerate standing up to 10 min independently with SpO2 maintaining above 90%, in preparation for ADLs. Additional ADL Goal #2: Pt to recall and verbalize 3 energy conservation techniques with 0 verbal cues.  OT Frequency: Min 3X/week   Barriers to D/C:            Co-evaluation PT/OT/SLP Co-Evaluation/Treatment: Yes Reason for Co-Treatment: To address functional/ADL transfers   OT goals addressed during session: ADL's and self-care      AM-PAC OT "6 Clicks" Daily Activity     Outcome Measure Help from another person eating meals?: None Help from another person taking care of personal  grooming?: A Little Help from another person toileting, which includes using toliet, bedpan, or urinal?: A Little Help from another person bathing (including washing, rinsing, drying)?: A Little Help from another person to put on and taking off regular upper body clothing?: A Little Help from another person to put on and taking off regular lower body clothing?: A Little 6 Click Score: 19   End of Session Equipment Utilized During Treatment: Oxygen Nurse Communication: Mobility status  Activity Tolerance: Patient tolerated  treatment well Patient left: in chair;with call bell/phone within reach;with chair alarm set  OT Visit Diagnosis: Unsteadiness on feet (R26.81);Muscle weakness (generalized) (M62.81)                Time: 4585-9292 OT Time Calculation (min): 34 min Charges:  OT General Charges $OT Visit: 1 Visit OT Evaluation $OT Eval Low Complexity: 1 Low  Peterson Ao OTR/L [phone]  Peterson Ao 06/16/2019, 10:58 AM

## 2019-06-16 NOTE — Evaluation (Signed)
Physical Therapy Evaluation Patient Details Name: Joseph Todd MRN: [id number] DOB: 11-Jun-1955 Today's Date: 06/16/2019   History of Present Illness  Joseph Todd is a 64 year old African-American male with past medical history remarkable for type 2 diabetes mellitus who presented to the ED with 1 week history of weakness, shortness of breath and nausea.  Patient reports dyspnea has been progressive with subjective fevers.  Denies vomiting/diarrhea, no chest pain, no abdominal pain. Recently hospitalized a week ago with syncope, Covid-19 PCR was negative at that time. In the ED, patient was noted to be hypoxic requiring nonrebreather to maintain adequate oxygenation.  ED PA referred for admission for Covid pneumonia with hypoxia.  Clinical Impression  Very pleasant and receptive male patient. History of recent fall from passing out in bathroom, face forward but hit left shoulder. Lives with spouse in 2 story home, but stays downstairs. Ambulatory with no AD. Works full time in [place], and was independent in all ADLs in PLOF. Currently on HFC, and was not using any oxygen at home. Monitor closely for O2 sats as well as RR- did have high RR of 51 today. Quick one minute recovery from desat to 85% and back to 90's. Should benefit from PT to address goals to promote optimal functional outcomes. Continue to remind about pursed lip breathing and energy conservation, controlling breathing rate.     Follow Up Recommendations Home health PT(Pending progression while here)    Equipment Recommendations       Recommendations for Other Services       Precautions / Restrictions Precautions Precautions: Fall Precaution Comments: Monitor SpO2 and RR Restrictions Weight Bearing Restrictions: No Other Position/Activity Restrictions: Monitor closely as noted above related to O2 sats and RR.      Mobility  Bed Mobility Overal bed mobility: Needs Assistance Bed Mobility: Supine to Sit     Supine to sit:  Supervision;HOB elevated     General bed mobility comments: Use of bedrail. Supervision for line management and safety  Transfers Overall transfer level: Needs assistance Equipment used: None Transfers: Sit to/from Stand Sit to Stand: Supervision         General transfer comment: Supervision to ensure safety and for line management.   Ambulation/Gait   Gait Distance (Feet): 24 Feet(to and from bathroom) Assistive device: None Gait Pattern/deviations: Narrow base of support;Shuffle   Gait velocity interpretation: 1.31 - 2.62 ft/sec, indicative of limited community ambulator General Gait Details: two episodes of slight unsteadiness- but no blatant LOB.  Stairs            Wheelchair Mobility    Modified Rankin (Stroke Patients Only)       Balance Overall balance assessment: Mild deficits observed, not formally tested                                           Pertinent Vitals/Pain Pain Assessment: No/denies pain Pain Location: left shoulder  from recent fall Pain Descriptors / Indicators: Constant    Home Living Family/patient expects to be discharged to:: Private residence Living Arrangements: Spouse/significant other Available Help at Discharge: Family Type of Home: House Home Access: Stairs to enter   Technical brewer of Steps: 2 Home Layout: Two level;Able to live on main level with bedroom/bathroom Home Equipment: Hand held shower head      Prior Function Level of Independence: Independent  Comments: Pt independent in ADLs, IADLs, and mobility. Pt does not ambulate with an assistive device and reports 1 fall in the last 6 months. 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 Pt does not use oxygen at home.     Hand Dominance   Dominant Hand: Right    Extremity/Trunk Assessment        Lower Extremity Assessment Lower Extremity Assessment: Generalized weakness    Cervical / Trunk Assessment Cervical / Trunk  Assessment: Kyphotic  Communication   Communication: No difficulties  Cognition Arousal/Alertness: Awake/alert Behavior During Therapy: WFL for tasks assessed/performed Overall Cognitive Status: Within Functional Limits for tasks assessed                                 General Comments: Very pleasant, receptive to PT      General Comments General comments (skin integrity, edema, etc.): Monitor O2 sats closely- as well as RR- RR fluctuated between 20's and 51. O2 dropped briefly to 85%m but in less than 1 minute recovered to back in 90's    Exercises General Exercises - Lower Extremity Ankle Circles/Pumps: AROM;Seated Quad Sets: AROM;Seated Short Arc Quad: AROM;Seated Other Exercises Other Exercises: Practiced pursed lip breathing with good return demo.   Assessment/Plan    PT Assessment Patient needs continued PT services  PT Problem List Decreased strength;Decreased activity tolerance;Decreased mobility;Cardiopulmonary status limiting activity       PT Treatment Interventions Gait training;Functional mobility training;Patient/family education;Therapeutic activities;Therapeutic exercise    PT Goals (Current goals can be found in the Care Plan section)  Acute Rehab PT Goals Patient Stated Goal: to go home PT Goal Formulation: With patient Time For Goal Achievement: 06/30/19 Potential to Achieve Goals: Good    Frequency Min 3X/week   Barriers to discharge        Co-evaluation               AM-PAC PT "6 Clicks" Mobility  Outcome Measure Help needed turning from your back to your side while in a flat bed without using bedrails?: None Help needed moving from lying on your back to sitting on the side of a flat bed without using bedrails?: A Little Help needed moving to and from a bed to a chair (including a wheelchair)?: A Little Help needed standing up from a chair using your arms (e.g., wheelchair or bedside chair)?: A Little Help needed to walk in  hospital room?: A Little Help needed climbing 3-5 steps with a railing? : A Little 6 Click Score: 19    End of Session   Activity Tolerance: Patient tolerated treatment well Patient left: in chair;with call bell/phone within reach Nurse Communication: Mobility status PT Visit Diagnosis: Muscle weakness (generalized) (M62.81);Unsteadiness on feet (R26.81)    Time: 1018-1050 PT Time Calculation (min) (ACUTE ONLY): 32 min   Charges:   PT Evaluation $PT Eval Moderate Complexity: 1 Mod PT Treatments $Gait Training: 8-22 mins      [name], PT # [phone] CGV cell  Ephriam Jenkins 06/16/2019, 4:26 PM

## 2019-06-16 NOTE — Progress Notes (Signed)
PROGRESS NOTE    Joseph Todd  XHB:[id number] DOB: [birth year] DOA: 06/14/2019 PCP: Patient, No Pcp Per    Brief Narrative:   Joseph Todd is a 64 year old African-American male with past medical history remarkable for type 2 diabetes mellitus who presented to the ED with 1 week history of weakness, shortness of breath and nausea.  Patient reports dyspnea has been progressive with subjective fevers.  Denies vomiting/diarrhea, no chest pain, no abdominal pain.  Recently hospitalized a week ago with syncope, Covid-19 PCR was negative at that time.  In the ED, patient was noted to be hypoxic requiring nonrebreather to maintain adequate oxygenation.  ED PA referred for admission for Covid pneumonia with hypoxia.   Assessment & Plan:   Principal Problem:   Pneumonia due to COVID-19 virus Active Problems:   DM (diabetes mellitus) ([place])   Acute respiratory failure due to COVID-19 [facility])   Acute hypoxic respiratory failure secondary to acute Covid-19 viral pneumonia during the ongoing 2020 Covid 19 Pandemic - POA Patient presenting with progressive shortness of breath, nausea over the past week.  Chest x-ray with multifocal patchy/groundglass opacities predominant within the lower lungs, consistent with multifocal pneumonia. --D-dimer 1.07--> <0.00 --CRP 23.7-->15.7 --Ferritin 2004-->1956 --Continue remdesivir, plan 5-day course --continue Solu-Medrol 40mg  IV BID --Actemra 2/6; will repeat dose today --Ordered convalescent plasma --Continue supplemental oxygen, titrate to maintain SPO2 greater than 92%; currently on 10 L HFNC --Continue supportive care with vitamin C, zinc, Tylenol, antitussives, combivent MDI --Follow CBC, CMP, D-dimer, ferritin, and CRP daily --Continue prone positioning when able while awake --Continue airborne/contact isolation precautions  Type 2 diabetes mellitus; poorly controlled Hemoglobin A1c 11.9 on 06/09/2019, not well controlled.  On Metformin 500 mg p.o.  daily at home. --Increase Lantus from 6 to 10u [place] BID today --Insulin sliding scale for further coverage; closely monitor given need of IV steroids --Diabetic educator following   DVT prophylaxis: Lovenox Code Status: Full code Family Communication: Discussed with patient extensively at bedside Disposition Plan: To be determined, pending PT/OT evaluation pending, from home, currently unsafe discharge secondary to high FiO2 requirements   Consultants:   None  Procedures:   None  Covid-19 treatment:   Remdesivir 2/5>>  Actemra 2/6, 2/7  Decadron 2/5 - 2/6  Solu-Medrol 2/6>>  Convalescent plasma 2/7   Subjective: Patient seen and examined at bedside, lying comfortably in bed.  Continues on high flow nasal cannula 10 L/min.  No other specific complaints at this time.  Discussed with patient we will repeat Actemra today and give convalescent plasma as he consents.  Denies headache, no fever/chills/night sweats, no vomiting/diarrhea, no chest pain, palpitations, no abdominal pain.  No acute events overnight per nursing staff.  Objective: Vitals:   06/15/19 1931 06/16/19 0010 06/16/19 0431 06/16/19 0740  BP: 136/83 133/83 (!) 142/76 120/76  Pulse: (!) 104 (!) 104 100 (!) 101  Resp: 20 20 (!) 21 (!) 35  Temp: 98.1 F (36.7 C) 97.7 F (36.5 C) 98.3 F (36.8 C) 97.8 F (36.6 C)  TempSrc: Oral Oral Oral Oral  SpO2: 91% 92% 93% 93%  Weight:      Height:        Intake/Output Summary (Last 24 hours) at 06/16/2019 1049 Last data filed at 06/16/2019 0200 Gross per 24 hour  Intake 1735.49 ml  Output 1700 ml  Net 35.49 ml   Filed Weights   06/14/19 1801 06/15/19 0124  Weight: 80.7 kg 79.9 kg    Examination:  General exam: Appears calm and comfortable  Respiratory system: Clear to auscultation.  Slightly increased respiratory effort, on 10 L high flow nasal cannula Cardiovascular system: S1 & S2 heard, RRR. No JVD, murmurs, rubs, gallops or clicks. No pedal edema.  Gastrointestinal system: Abdomen is nondistended, soft and nontender. No organomegaly or masses felt. Normal bowel sounds heard. Central nervous system: Alert and oriented. No focal neurological deficits. Extremities: Symmetric 5 x 5 power. Skin: No rashes, lesions or ulcers Psychiatry: Judgement and insight appear normal. Mood & affect appropriate.     Data Reviewed: I have personally reviewed following labs and imaging studies  CBC: Recent Labs  Lab 06/10/19 0333 06/14/19 1825 06/15/19 0450 06/16/19 0319  WBC 3.8* 8.3 8.7 8.3  NEUTROABS  --  6.6 7.0 6.2  HGB 16.9 15.0 14.8 14.6  HCT 51.6 44.9 46.9 45.7  MCV 90.1 88.2 90.9 90.3  PLT 224 274 339 368   Basic Metabolic Panel: Recent Labs  Lab 06/10/19 0333 06/14/19 1825 06/15/19 0450 06/16/19 0319  NA 132* 132* 133* 138  K 4.6 4.2 5.0 5.0  CL 94* 96* 94* 102  CO2 27 25 23 27   GLUCOSE 195* 212* 229* 225*  BUN 12 15 15 22   CREATININE 1.28* 1.04 1.19 1.12  CALCIUM 8.9 8.2* 7.9* 8.5*   GFR: Estimated Creatinine Clearance: 67 mL/min (by C-G formula based on SCr of 1.12 mg/dL). Liver Function Tests: Recent Labs  Lab 06/14/19 1825 06/15/19 0450 06/16/19 0319  AST 48* 50* 44*  ALT 56* 54* 48*  ALKPHOS 58 61 67  BILITOT 1.3* 1.2 0.9  PROT 7.1 7.3 6.9  ALBUMIN 2.7* 2.5* 2.6*   No results for input(s): LIPASE, AMYLASE in the last 168 hours. No results for input(s): AMMONIA in the last 168 hours. Coagulation Profile: No results for input(s): INR, PROTIME in the last 168 hours. Cardiac Enzymes: No results for input(s): CKTOTAL, CKMB, CKMBINDEX, TROPONINI in the last 168 hours. BNP (last 3 results) No results for input(s): PROBNP in the last 8760 hours. HbA1C: No results for input(s): HGBA1C in the last 72 hours. CBG: Recent Labs  Lab 06/09/19 2252 06/10/19 0638 06/15/19 0036 06/15/19 0800 06/15/19 1118  GLUCAP 206* 174* 216* 194* 282*   Lipid Profile: No results for input(s): CHOL, HDL, LDLCALC, TRIG,  CHOLHDL, LDLDIRECT in the last 72 hours. Thyroid Function Tests: No results for input(s): TSH, T4TOTAL, FREET4, T3FREE, THYROIDAB in the last 72 hours. Anemia Panel: Recent Labs    06/15/19 0450 06/16/19 0319  FERRITIN 2,004* 1,956*   Sepsis Labs: Recent Labs  Lab 06/15/19 0450  PROCALCITON 0.57    Recent Results (from the past 240 hour(s))  Respiratory Panel by RT PCR (Flu A&B, Covid) - Nasopharyngeal Swab     Status: None   Collection Time: 06/09/19  5:37 AM   Specimen: Nasopharyngeal Swab  Result Value Ref Range Status   SARS Coronavirus 2 by RT PCR NEGATIVE NEGATIVE Final    Comment: (NOTE) SARS-CoV-2 target nucleic acids are NOT DETECTED. The SARS-CoV-2 RNA is generally detectable in upper respiratoy specimens during the acute phase of infection. The lowest concentration of SARS-CoV-2 viral copies this assay can detect is 131 copies/mL. A negative result does not preclude SARS-Cov-2 infection and should not be used as the sole basis for treatment or other patient management decisions. A negative result may occur with  improper specimen collection/handling, submission of specimen other than nasopharyngeal swab, presence of viral mutation(s) within the areas targeted by this assay, and inadequate number of viral copies (<131 copies/mL). A  negative result must be combined with clinical observations, patient history, and epidemiological information. The expected result is Negative. Fact Sheet for Patients:  https://www.moore.com/ Fact Sheet for Healthcare Providers:  https://www.young.biz/ This test is not yet ap proved or cleared by the [place] FDA and  has been authorized for detection and/or diagnosis of SARS-CoV-2 by FDA under an Emergency Use Authorization (EUA). This EUA will remain  in effect (meaning this test can be used) for the duration of the COVID-19 declaration under Section 564(b)(1) of the Act, 21 U.S.C.  section 360bbb-3(b)(1), unless the authorization is terminated or revoked sooner.    Influenza A by PCR NEGATIVE NEGATIVE Final   Influenza B by PCR NEGATIVE NEGATIVE Final    Comment: (NOTE) The Xpert Xpress SARS-CoV-2/FLU/RSV assay is intended as an aid in  the diagnosis of influenza from Nasopharyngeal swab specimens and  should not be used as a sole basis for treatment. Nasal washings and  aspirates are unacceptable for Xpert Xpress SARS-CoV-2/FLU/RSV  testing. Fact Sheet for Patients: https://www.moore.com/ Fact Sheet for Healthcare Providers: https://www.young.biz/ This test is not yet approved or cleared by the [place] FDA and  has been authorized for detection and/or diagnosis of SARS-CoV-2 by  FDA under an Emergency Use Authorization (EUA). This EUA will remain  in effect (meaning this test can be used) for the duration of the  Covid-19 declaration under Section 564(b)(1) of the Act, 21  U.S.C. section 360bbb-3(b)(1), unless the authorization is  terminated or revoked. Performed at [facility], [address], [place], [place] [address]   Respiratory Panel by RT PCR (Flu A&B, Covid) - Nasopharyngeal Swab     Status: Abnormal   Collection Time: 06/14/19  6:25 PM   Specimen: Nasopharyngeal Swab  Result Value Ref Range Status   SARS Coronavirus 2 by RT PCR POSITIVE (A) NEGATIVE Final    Comment: RESULT CALLED TO, READ BACK BY AND VERIFIED WITH: B.BROOKS AT 2054 ON 06/14/19 BY N.THOMPSON (NOTE) SARS-CoV-2 target nucleic acids are DETECTED. SARS-CoV-2 RNA is generally detectable in upper respiratory specimens  during the acute phase of infection. Positive results are indicative of the presence of the identified virus, but do not rule out bacterial infection or co-infection with other pathogens not detected by the test. Clinical correlation with patient history and other diagnostic information is necessary to  determine patient infection status. The expected result is Negative. Fact Sheet for Patients:  https://www.moore.com/ Fact Sheet for Healthcare Providers: https://www.young.biz/ This test is not yet approved or cleared by the [place] FDA and  has been authorized for detection and/or diagnosis of SARS-CoV-2 by FDA under an Emergency Use Authorization (EUA).  This EUA will remain in effect (meaning this test can be [place] ed) for the duration of  the COVID-19 declaration under Section 564(b)(1) of the Act, 21 U.S.C. section 360bbb-3(b)(1), unless the authorization is terminated or revoked sooner.    Influenza A by PCR NEGATIVE NEGATIVE Final   Influenza B by PCR NEGATIVE NEGATIVE Final    Comment: (NOTE) The Xpert Xpress SARS-CoV-2/FLU/RSV assay is intended as an aid in  the diagnosis of influenza from Nasopharyngeal swab specimens and  should not be used as a sole basis for treatment. Nasal washings and  aspirates are unacceptable for Xpert Xpress SARS-CoV-2/FLU/RSV  testing. Fact Sheet for Patients: https://www.moore.com/ Fact Sheet for Healthcare Providers: https://www.young.biz/ This test is not yet approved or cleared by the [place] FDA and  has been authorized for detection and/or diagnosis of SARS-CoV-2  by  FDA under an Emergency Use Authorization (EUA). This EUA will remain  in effect (meaning this test can be used) for the duration of the  Covid-19 declaration under Section 564(b)(1) of the Act, 21  U.S.C. section 360bbb-3(b)(1), unless the authorization is  terminated or revoked. Performed at [facility], [address], [place], [place] [address]          Radiology Studies: DG Chest Portable 1 View  Result Date: 06/14/2019 CLINICAL DATA:  Dyspnea EXAM: PORTABLE CHEST 1 VIEW COMPARISON:  August 05, 2008 FINDINGS: The heart size and mediastinal contours are within  normal limits. Hazy/patchy airspace opacity seen predominantly within both lower lungs at the periphery. The visualized skeletal structures are unremarkable. IMPRESSION: Multifocal patchy/ground-glass opacities seen predominantly within the lower lungs, likely consistent multifocal pneumonia. Electronically Signed   By: Jonna Clark M.D.   On: 06/14/2019 19:07        Scheduled Meds: . vitamin C  500 mg Oral Daily  . enoxaparin (LOVENOX) injection  40 mg Subcutaneous Q24H  . insulin aspart  0-5 Units Subcutaneous QHS  . insulin aspart  0-9 Units Subcutaneous TID WC  . insulin detemir  10 Units Subcutaneous BID  . Ipratropium-Albuterol  1 puff Inhalation Q6H  . methylPREDNISolone (SOLU-MEDROL) injection  0.5 mg/kg Intravenous Q12H  . senna-docusate  2 tablet Oral BID  . sodium chloride flush  3 mL Intravenous Q12H  . zinc sulfate  220 mg Oral Daily   Continuous Infusions: . sodium chloride    . remdesivir 100 mg in NS 100 mL 100 mg (06/16/19 1027)  . tocilizumab (ACTEMRA) - non-COVID treatment       LOS: 2 days    Time spent: 38 minutes spent on chart review, discussion with nursing staff, consultants, updating family and interview/physical exam; more than 50% of that time was spent in counseling and/or coordination of care.    Alvira Philips [place], DO [facility] Available via Epic secure chat 7am-7pm After these hours, please refer to coverage provider listed on [email or web address] 06/16/2019, 10:49 AM

## 2019-06-17 LAB — COMPREHENSIVE METABOLIC PANEL WITH GFR
ALT: 57 U/L — ABNORMAL HIGH (ref 0–44)
AST: 53 U/L — ABNORMAL HIGH (ref 15–41)
Albumin: 2.8 g/dL — ABNORMAL LOW (ref 3.5–5.0)
Alkaline Phosphatase: 78 U/L (ref 38–126)
Anion gap: 10 (ref 5–15)
BUN: 22 mg/dL (ref 8–23)
CO2: 26 mmol/L (ref 22–32)
Calcium: 8.8 mg/dL — ABNORMAL LOW (ref 8.9–10.3)
Chloride: 101 mmol/L (ref 98–111)
Creatinine, Ser: 0.99 mg/dL (ref 0.61–1.24)
GFR calc Af Amer: >60 mL/min
GFR calc non Af Amer: >60 mL/min
Glucose, Bld: 198 mg/dL — ABNORMAL HIGH (ref 70–99)
Potassium: 5 mmol/L (ref 3.5–5.1)
Sodium: 137 mmol/L (ref 135–145)
Total Bilirubin: 0.6 mg/dL (ref 0.3–1.2)
Total Protein: 7.2 g/dL (ref 6.5–8.1)

## 2019-06-17 LAB — CBC WITH DIFFERENTIAL/PLATELET
Abs Immature Granulocytes: 0.46 10*3/uL — ABNORMAL HIGH (ref 0.00–0.07)
Basophils Absolute: 0 10*3/uL (ref 0.0–0.1)
Basophils Relative: 0 %
Eosinophils Absolute: 0 10*3/uL (ref 0.0–0.5)
Eosinophils Relative: 0 %
HCT: 44.2 % (ref 39.0–52.0)
Hemoglobin: 14.1 g/dL (ref 13.0–17.0)
Immature Granulocytes: 5 %
Lymphocytes Relative: 16 %
Lymphs Abs: 1.5 10*3/uL (ref 0.7–4.0)
MCH: 28.9 pg (ref 26.0–34.0)
MCHC: 31.9 g/dL (ref 30.0–36.0)
MCV: 90.6 fL (ref 80.0–100.0)
Monocytes Absolute: 0.3 10*3/uL (ref 0.1–1.0)
Monocytes Relative: 4 %
Neutro Abs: 7.1 10*3/uL (ref 1.7–7.7)
Neutrophils Relative %: 75 %
Platelets: 383 10*3/uL (ref 150–400)
RBC: 4.88 MIL/uL (ref 4.22–5.81)
RDW: 13.3 % (ref 11.5–15.5)
WBC: 9.4 10*3/uL (ref 4.0–10.5)
nRBC: 0 % (ref 0.0–0.2)

## 2019-06-17 LAB — GLUCOSE, CAPILLARY: Glucose-Capillary: 288 mg/dL — ABNORMAL HIGH (ref 70–99)

## 2019-06-17 LAB — BPAM FFP
Blood Product Expiration Date: 202102081551
ISSUE DATE / TIME: 202102071554
Unit Type and Rh: 9500

## 2019-06-17 LAB — PREPARE FRESH FROZEN PLASMA: Unit division: 0

## 2019-06-17 LAB — D-DIMER, QUANTITATIVE: D-Dimer, Quant: 2.95 ug{FEU}/mL — ABNORMAL HIGH (ref 0.00–0.50)

## 2019-06-17 LAB — FERRITIN: Ferritin: 1703 ng/mL — ABNORMAL HIGH (ref 24–336)

## 2019-06-17 LAB — C-REACTIVE PROTEIN: CRP: 7.4 mg/dL — ABNORMAL HIGH (ref <1.0)

## 2019-06-17 MED ORDER — LINAGLIPTIN 5 MG PO TABS
5.0000 mg | ORAL_TABLET | Freq: Every day | ORAL | Status: DC
  Administered 2019-06-17 – 2019-06-21 (×5): 5 mg via ORAL
  Filled 2019-06-17 (×6): qty 1

## 2019-06-17 MED ORDER — INSULIN ASPART 100 UNIT/ML ~~LOC~~ SOLN
4.0000 [IU] | Freq: Three times a day (TID) | SUBCUTANEOUS | Status: DC
  Administered 2019-06-17: 4 [IU] via SUBCUTANEOUS

## 2019-06-17 MED ORDER — METHYLPREDNISOLONE SODIUM SUCC 40 MG IJ SOLR
40.0000 mg | Freq: Every day | INTRAMUSCULAR | Status: DC
  Administered 2019-06-18 – 2019-06-19 (×2): 40 mg via INTRAVENOUS
  Filled 2019-06-17 (×2): qty 1

## 2019-06-17 MED ORDER — LIVING WELL WITH DIABETES BOOK
Freq: Once | Status: AC
  Filled 2019-06-17: qty 1

## 2019-06-17 NOTE — Progress Notes (Signed)
Pt has continuous fluctuations in HR and RR.  This has been baseline for at least 4 days.

## 2019-06-17 NOTE — Progress Notes (Signed)
Inpatient Diabetes Program Recommendations  AACE/ADA: New Consensus Statement on Inpatient Glycemic Control (2015)  Target Ranges:  Prepandial:   less than 140 mg/dL      Peak postprandial:   less than 180 mg/dL (1-2 hours)      Critically ill patients:  140 - 180 mg/dL   Lab Results  Component Value Date   GLUCAP 288 (H) 06/15/2019   HGBA1C 11.9 (H) 06/09/2019    Review of Glycemic Control  Inpatient Diabetes Program Recommendations:   Postprandial CBGs elevated. -Add Novolog 4 units tid ac meals if eats 50% meals -Tradjenta 5 mg qd  Thank you, Billy Fischer. Brooklyn Jeff, RN, MSN, CDE  Diabetes Coordinator Inpatient Glycemic Control Team Team Pager [phone] (8am-5pm) 06/17/2019 1:40 PM

## 2019-06-17 NOTE — Progress Notes (Signed)
PROGRESS NOTE    Joseph Todd  WUJ:[id number] DOB: [birth year] DOA: 06/14/2019 PCP: Patient, No Pcp Per    Brief Narrative:   Joseph Todd is a 64 year old African-American male with past medical history remarkable for type 2 diabetes mellitus who presented to the ED with 1 week history of weakness, shortness of breath and nausea.  Patient reports dyspnea has been progressive with subjective fevers.  Denies vomiting/diarrhea, no chest pain, no abdominal pain.  Recently hospitalized a week ago with syncope, Covid-19 PCR was negative at that time.  In the ED, patient was noted to be hypoxic requiring nonrebreather to maintain adequate oxygenation.  ED PA referred for admission for Covid pneumonia with hypoxia.   Assessment & Plan:   Principal Problem:   Pneumonia due to COVID-19 virus Active Problems:   DM (diabetes mellitus) ([place])   Acute respiratory failure due to COVID-19 [facility])   Acute hypoxic respiratory failure secondary to acute Covid-19 viral pneumonia during the ongoing 2020 Covid 19 Pandemic - POA Patient presenting with progressive shortness of breath, nausea over the past week.  Chest x-ray with multifocal patchy/groundglass opacities predominant within the lower lungs, consistent with multifocal pneumonia. --D-dimer 1.07--> <0.00-->2.95 --CRP 23.7-->15.7-->7.4 --Ferritin 2004-->1956-->1703 --s/p Covid-19 convalescent plasma on 2/7 --s/p Actemra 2/6 and 2/7 --Continue remdesivir, plan 5-day course; day #4/5 --decrease Solu-Medrol from 40mg  IV BID to 40mg  IV daily --Continue supplemental oxygen, titrate to maintain SPO2 greater than 92%; currently on 6 L HFNC --Continue supportive care with vitamin C, zinc, Tylenol, antitussives, combivent MDI --Follow CBC, CMP, D-dimer, ferritin, and CRP daily --Continue prone positioning when able while awake --Continue airborne/contact isolation precautions  Type 2 diabetes mellitus; poorly controlled Hemoglobin A1c 11.9 on  06/09/2019, not well controlled.  On Metformin 500 mg p.o. daily at home. --Continue Lantus 10u [place] BID  --Insulin sliding scale for further coverage; closely monitor given need of IV steroids --Diabetic educator following   DVT prophylaxis: Lovenox Code Status: Full code Family Communication: Discussed with patient extensively at bedside Disposition Plan: from home, continue inpatient, PT/OT recommend home health on discharge, currently unsafe discharge secondary to high FiO2 requirements   Consultants:   None  Procedures:   None  Covid-19 treatment:   Remdesivir 2/5>>  Actemra 2/6, 2/7  Decadron 2/5 - 2/6  Solu-Medrol 2/6>>  Convalescent plasma 2/7   Subjective: Patient seen and examined at bedside, lying comfortably in bed.  Continues on high flow nasal cannula, down to 6 L/min.  Breathing improved, but not yet back to his normal baseline.  No other specific complaints at this time.  Denies headache, no fever/chills/night sweats, no vomiting/diarrhea, no chest pain, palpitations, no abdominal pain.  No acute events overnight per nursing staff.  Objective: Vitals:   06/16/19 2045 06/16/19 2320 06/17/19 0336 06/17/19 0747  BP:  133/77 136/70 140/84  Pulse: (!) 103  100 (!) 101  Resp: (!) 26 (!) 27 (!) 25 20  Temp: 98.3 F (36.8 C) 98.6 F (37 C) 98.6 F (37 C) 98.1 F (36.7 C)  TempSrc: Oral Oral Oral Oral  SpO2: 92% 93% 94% 94%  Weight:      Height:        Intake/Output Summary (Last 24 hours) at 06/17/2019 1131 Last data filed at 06/17/2019 0756 Gross per 24 hour  Intake 1244.55 ml  Output 1625 ml  Net -380.45 ml   Filed Weights   06/14/19 1801 06/15/19 0124  Weight: 80.7 kg 79.9 kg    Examination:  General exam: Appears  calm and comfortable  Respiratory system: Clear to auscultation.  Slightly increased respiratory effort, on 6 L high flow nasal cannula Cardiovascular system: S1 & S2 heard, RRR. No JVD, murmurs, rubs, gallops or clicks. No pedal  edema. Gastrointestinal system: Abdomen is nondistended, soft and nontender. No organomegaly or masses felt. Normal bowel sounds heard. Central nervous system: Alert and oriented. No focal neurological deficits. Extremities: Symmetric 5 x 5 power. Skin: No rashes, lesions or ulcers Psychiatry: Judgement and insight appear normal. Mood & affect appropriate.     Data Reviewed: I have personally reviewed following labs and imaging studies  CBC: Recent Labs  Lab 06/14/19 1825 06/15/19 0450 06/16/19 0319 06/17/19 0306  WBC 8.3 8.7 8.3 9.4  NEUTROABS 6.6 7.0 6.2 7.1  HGB 15.0 14.8 14.6 14.1  HCT 44.9 46.9 45.7 44.2  MCV 88.2 90.9 90.3 90.6  PLT 274 339 368 383   Basic Metabolic Panel: Recent Labs  Lab 06/14/19 1825 06/15/19 0450 06/16/19 0319 06/17/19 0306  NA 132* 133* 138 137  K 4.2 5.0 5.0 5.0  CL 96* 94* 102 101  CO2 25 23 27 26   GLUCOSE 212* 229* 225* 198*  BUN 15 15 22 22   CREATININE 1.04 1.19 1.12 0.99  CALCIUM 8.2* 7.9* 8.5* 8.8*   GFR: Estimated Creatinine Clearance: 75.8 mL/min (by C-G formula based on SCr of 0.99 mg/dL). Liver Function Tests: Recent Labs  Lab 06/14/19 1825 06/15/19 0450 06/16/19 0319 06/17/19 0306  AST 48* 50* 44* 53*  ALT 56* 54* 48* 57*  ALKPHOS 58 61 67 78  BILITOT 1.3* 1.2 0.9 0.6  PROT 7.1 7.3 6.9 7.2  ALBUMIN 2.7* 2.5* 2.6* 2.8*   No results for input(s): LIPASE, AMYLASE in the last 168 hours. No results for input(s): AMMONIA in the last 168 hours. Coagulation Profile: No results for input(s): INR, PROTIME in the last 168 hours. Cardiac Enzymes: No results for input(s): CKTOTAL, CKMB, CKMBINDEX, TROPONINI in the last 168 hours. BNP (last 3 results) No results for input(s): PROBNP in the last 8760 hours. HbA1C: No results for input(s): HGBA1C in the last 72 hours. CBG: Recent Labs  Lab 06/15/19 0036 06/15/19 0800 06/15/19 1118  GLUCAP 216* 194* 282*   Lipid Profile: No results for input(s): CHOL, HDL, LDLCALC,  TRIG, CHOLHDL, LDLDIRECT in the last 72 hours. Thyroid Function Tests: No results for input(s): TSH, T4TOTAL, FREET4, T3FREE, THYROIDAB in the last 72 hours. Anemia Panel: Recent Labs    06/16/19 0319 06/17/19 0306  FERRITIN 1,956* 1,703*   Sepsis Labs: Recent Labs  Lab 06/15/19 0450  PROCALCITON 0.57    Recent Results (from the past 240 hour(s))  Respiratory Panel by RT PCR (Flu A&B, Covid) - Nasopharyngeal Swab     Status: None   Collection Time: 06/09/19  5:37 AM   Specimen: Nasopharyngeal Swab  Result Value Ref Range Status   SARS Coronavirus 2 by RT PCR NEGATIVE NEGATIVE Final    Comment: (NOTE) SARS-CoV-2 target nucleic acids are NOT DETECTED. The SARS-CoV-2 RNA is generally detectable in upper respiratoy specimens during the acute phase of infection. The lowest concentration of SARS-CoV-2 viral copies this assay can detect is 131 copies/mL. A negative result does not preclude SARS-Cov-2 infection and should not be used as the sole basis for treatment or other patient management decisions. A negative result may occur with  improper specimen collection/handling, submission of specimen other than nasopharyngeal swab, presence of viral mutation(s) within the areas targeted by this assay, and inadequate number of viral  copies (<131 copies/mL). A negative result must be combined with clinical observations, patient history, and epidemiological information. The expected result is Negative. Fact Sheet for Patients:  https://www.moore.com/ Fact Sheet for Healthcare Providers:  https://www.young.biz/ This test is not yet ap proved or cleared by the [place] FDA and  has been authorized for detection and/or diagnosis of SARS-CoV-2 by FDA under an Emergency Use Authorization (EUA). This EUA will remain  in effect (meaning this test can be used) for the duration of the COVID-19 declaration under Section 564(b)(1) of the Act, 21  U.S.C. section 360bbb-3(b)(1), unless the authorization is terminated or revoked sooner.    Influenza A by PCR NEGATIVE NEGATIVE Final   Influenza B by PCR NEGATIVE NEGATIVE Final    Comment: (NOTE) The Xpert Xpress SARS-CoV-2/FLU/RSV assay is intended as an aid in  the diagnosis of influenza from Nasopharyngeal swab specimens and  should not be used as a sole basis for treatment. Nasal washings and  aspirates are unacceptable for Xpert Xpress SARS-CoV-2/FLU/RSV  testing. Fact Sheet for Patients: https://www.moore.com/ Fact Sheet for Healthcare Providers: https://www.young.biz/ This test is not yet approved or cleared by the [place] FDA and  has been authorized for detection and/or diagnosis of SARS-CoV-2 by  FDA under an Emergency Use Authorization (EUA). This EUA will remain  in effect (meaning this test can be used) for the duration of the  Covid-19 declaration under Section 564(b)(1) of the Act, 21  U.S.C. section 360bbb-3(b)(1), unless the authorization is  terminated or revoked. Performed at [facility], [address], [place], [place] [address]   Respiratory Panel by RT PCR (Flu A&B, Covid) - Nasopharyngeal Swab     Status: Abnormal   Collection Time: 06/14/19  6:25 PM   Specimen: Nasopharyngeal Swab  Result Value Ref Range Status   SARS Coronavirus 2 by RT PCR POSITIVE (A) NEGATIVE Final    Comment: RESULT CALLED TO, READ BACK BY AND VERIFIED WITH: B.BROOKS AT 2054 ON 06/14/19 BY N.THOMPSON (NOTE) SARS-CoV-2 target nucleic acids are DETECTED. SARS-CoV-2 RNA is generally detectable in upper respiratory specimens  during the acute phase of infection. Positive results are indicative of the presence of the identified virus, but do not rule out bacterial infection or co-infection with other pathogens not detected by the test. Clinical correlation with patient history and other diagnostic information is necessary  to determine patient infection status. The expected result is Negative. Fact Sheet for Patients:  https://www.moore.com/ Fact Sheet for Healthcare Providers: https://www.young.biz/ This test is not yet approved or cleared by the [place] FDA and  has been authorized for detection and/or diagnosis of SARS-CoV-2 by FDA under an Emergency Use Authorization (EUA).  This EUA will remain in effect (meaning this test can be [place] ed) for the duration of  the COVID-19 declaration under Section 564(b)(1) of the Act, 21 U.S.C. section 360bbb-3(b)(1), unless the authorization is terminated or revoked sooner.    Influenza A by PCR NEGATIVE NEGATIVE Final   Influenza B by PCR NEGATIVE NEGATIVE Final    Comment: (NOTE) The Xpert Xpress SARS-CoV-2/FLU/RSV assay is intended as an aid in  the diagnosis of influenza from Nasopharyngeal swab specimens and  should not be used as a sole basis for treatment. Nasal washings and  aspirates are unacceptable for Xpert Xpress SARS-CoV-2/FLU/RSV  testing. Fact Sheet for Patients: https://www.moore.com/ Fact Sheet for Healthcare Providers: https://www.young.biz/ This test is not yet approved or cleared by the [place] FDA and  has been authorized for detection  and/or diagnosis of SARS-CoV-2 by  FDA under an Emergency Use Authorization (EUA). This EUA will remain  in effect (meaning this test can be used) for the duration of the  Covid-19 declaration under Section 564(b)(1) of the Act, 21  U.S.C. section 360bbb-3(b)(1), unless the authorization is  terminated or revoked. Performed at [facility], [address] [place], [place] [address]          Radiology Studies: No results found.      Scheduled Meds: . vitamin C  500 mg Oral Daily  . enoxaparin (LOVENOX) injection  40 mg Subcutaneous Q24H  . insulin aspart  0-5 Units Subcutaneous QHS  .  insulin aspart  0-9 Units Subcutaneous TID WC  . insulin detemir  10 Units Subcutaneous BID  . Ipratropium-Albuterol  1 puff Inhalation Q6H  . methylPREDNISolone (SOLU-MEDROL) injection  0.5 mg/kg Intravenous Q12H  . senna-docusate  2 tablet Oral BID  . sodium chloride flush  3 mL Intravenous Q12H  . zinc sulfate  220 mg Oral Daily   Continuous Infusions: . sodium chloride    . remdesivir 100 mg in NS 100 mL 100 mg (06/17/19 0954)     LOS: 3 days    Time spent: 36 minutes spent on chart review, discussion with nursing staff, consultants, updating family and interview/physical exam; more than 50% of that time was spent in counseling and/or coordination of care.    Alvira Philips [place], DO [facility] Available via Epic secure chat 7am-7pm After these hours, please refer to coverage provider listed on [email or web address] 06/17/2019, 11:31 AM

## 2019-06-17 NOTE — Plan of Care (Signed)
  Problem: Education: Goal: Knowledge of risk factors and measures for prevention of condition will improve Outcome: Progressing   Problem: Coping: Goal: Psychosocial and spiritual needs will be supported Outcome: Progressing   Problem: Respiratory: Goal: Will maintain a patent airway Outcome: Progressing Goal: Complications related to the disease process, condition or treatment will be avoided or minimized Outcome: Progressing   

## 2019-06-17 NOTE — Plan of Care (Signed)
Plan of Care reviewed. 

## 2019-06-17 NOTE — Progress Notes (Signed)
Physical Therapy Treatment Patient Details Name: Joseph Todd MRN: [id number] DOB: 19-May-1955 Today's Date: 06/17/2019    History of Present Illness Joseph Todd is a 64 year old African-American male with past medical history remarkable for type 2 diabetes mellitus who presented to the ED with 1 week history of weakness, shortness of breath and nausea.  Patient reports dyspnea has been progressive with subjective fevers.  Denies vomiting/diarrhea, no chest pain, no abdominal pain. Recently hospitalized a week ago with syncope, Covid-19 PCR was negative at that time. In the ED, patient was noted to be hypoxic requiring nonrebreather to maintain adequate oxygenation.  ED PA referred for admission for Covid pneumonia with hypoxia.    PT Comments    Patient in [facility] chair when PT arrived. He requested to ambulate to the bathroom. Advised not to strain as he has had at least one episode of passing out prior to this admission. He was on RA and maintained O2 sats above 90% throughout session. Did not use any AD for ambulation, but did require Min Guard, Close supervision- checked on frequently while PT gave privacy in the bathroom. He was able to return demo on pursed lip breathing at good level. Still needs close monitoring for O2 sats and RR. Should continue to benefit from PT to further address goals for optimal functional outcomes, with emphasis on energy conservation proper breathing techniques, safety in ambulation, and general strengthening.   Follow Up Recommendations  Home health PT     Equipment Recommendations       Recommendations for Other Services       Precautions / Restrictions Precautions Precautions: Fall Precaution Comments: Monitor SpO2 and RR Restrictions Weight Bearing Restrictions: No Other Position/Activity Restrictions: Monitor closely as noted above related to O2 sats and RR.    Mobility  Bed Mobility Overal bed mobility: Modified Independent       Supine to sit:  Supervision;HOB elevated     General bed mobility comments: Use of bedrail. Supervision for line management and safety  Transfers Overall transfer level: Needs assistance Equipment used: None Transfers: Sit to/from Stand Sit to Stand: Supervision         General transfer comment: Supervision to ensure safety and for line management.   Ambulation/Gait Ambulation/Gait assistance: Supervision Gait Distance (Feet): 32 Feet Assistive device: None Gait Pattern/deviations: Narrow base of support;Shuffle   Gait velocity interpretation: 1.31 - 2.62 ft/sec, indicative of limited community Metallurgist Rankin (Stroke Patients Only)       Balance Overall balance assessment: Mild deficits observed, not formally tested                                          Cognition Arousal/Alertness: Awake/alert Behavior During Therapy: WFL for tasks assessed/performed Overall Cognitive Status: Within Functional Limits for tasks assessed                                 General Comments: Very pleasant, receptive to PT      Exercises Other Exercises Other Exercises: Practiced pursed lip breathing with good return demo. Other Exercises: Reminded to perform ankle pumps frequently both in seated and while in bed.    General Comments General comments (skin integrity, edema,  etc.): Monitor O2 sats and RR closely- O2 sats maintained above 90% today on RA- but RR increased to 42 at one point post ambulation to bathroom      Pertinent Vitals/Pain Pain Assessment: 0-10 Pain Score: 3  Pain Location: left shoulder  from recent fall Pain Descriptors / Indicators: Constant    Home Living                      Prior Function            PT Goals (current goals can now be found in the care plan section) Acute Rehab PT Goals Patient Stated Goal: to go home PT Goal Formulation: With  patient Time For Goal Achievement: 06/30/19 Potential to Achieve Goals: Good Progress towards PT goals: Progressing toward goals    Frequency    Min 3X/week      PT Plan      Co-evaluation              AM-PAC PT "6 Clicks" Mobility   Outcome Measure  Help needed turning from your back to your side while in a flat bed without using bedrails?: None Help needed moving from lying on your back to sitting on the side of a flat bed without using bedrails?: A Little Help needed moving to and from a bed to a chair (including a wheelchair)?: A Little Help needed standing up from a chair using your arms (e.g., wheelchair or bedside chair)?: A Little Help needed to walk in hospital room?: A Little Help needed climbing 3-5 steps with a railing? : A Little 6 Click Score: 19    End of Session   Activity Tolerance: Patient tolerated treatment well Patient left: in chair;with call bell/phone within reach Nurse Communication: Mobility status PT Visit Diagnosis: Muscle weakness (generalized) (M62.81);Unsteadiness on feet (R26.81)     Time: 0220-0251 PT Time Calculation (min) (ACUTE ONLY): 31 min  Charges:  $Gait Training: 8-22 mins $Therapeutic Exercise: 8-22 mins                   [name], PT # [phone] CGV cell   Ephriam Jenkins 06/17/2019, 5:12 PM

## 2019-06-18 ENCOUNTER — Inpatient Hospital Stay (HOSPITAL_COMMUNITY): Payer: No Typology Code available for payment source

## 2019-06-18 DIAGNOSIS — R609 Edema, unspecified: Secondary | ICD-10-CM

## 2019-06-18 LAB — CBC WITH DIFFERENTIAL/PLATELET
Abs Immature Granulocytes: 0.34 10*3/uL — ABNORMAL HIGH (ref 0.00–0.07)
Basophils Absolute: 0.1 10*3/uL (ref 0.0–0.1)
Basophils Relative: 1 %
Eosinophils Absolute: 0.2 10*3/uL (ref 0.0–0.5)
Eosinophils Relative: 2 %
HCT: 47.3 % (ref 39.0–52.0)
Hemoglobin: 15.1 g/dL (ref 13.0–17.0)
Immature Granulocytes: 4 %
Lymphocytes Relative: 28 %
Lymphs Abs: 2.2 10*3/uL (ref 0.7–4.0)
MCH: 28.7 pg (ref 26.0–34.0)
MCHC: 31.9 g/dL (ref 30.0–36.0)
MCV: 89.9 fL (ref 80.0–100.0)
Monocytes Absolute: 0.4 10*3/uL (ref 0.1–1.0)
Monocytes Relative: 5 %
Neutro Abs: 4.9 10*3/uL (ref 1.7–7.7)
Neutrophils Relative %: 60 %
Platelets: 363 10*3/uL (ref 150–400)
RBC: 5.26 MIL/uL (ref 4.22–5.81)
RDW: 13.2 % (ref 11.5–15.5)
WBC: 8.1 10*3/uL (ref 4.0–10.5)
nRBC: 0.5 % — ABNORMAL HIGH (ref 0.0–0.2)

## 2019-06-18 LAB — COMPREHENSIVE METABOLIC PANEL
ALT: 69 U/L — ABNORMAL HIGH (ref 0–44)
AST: 61 U/L — ABNORMAL HIGH (ref 15–41)
Albumin: 2.6 g/dL — ABNORMAL LOW (ref 3.5–5.0)
Alkaline Phosphatase: 82 U/L (ref 38–126)
Anion gap: 9 (ref 5–15)
BUN: 22 mg/dL (ref 8–23)
CO2: 27 mmol/L (ref 22–32)
Calcium: 8.7 mg/dL — ABNORMAL LOW (ref 8.9–10.3)
Chloride: 102 mmol/L (ref 98–111)
Creatinine, Ser: 1.14 mg/dL (ref 0.61–1.24)
GFR calc Af Amer: >60 mL/min (ref >60)
GFR calc non Af Amer: >60 mL/min (ref >60)
Glucose, Bld: 85 mg/dL (ref 70–99)
Potassium: 4.6 mmol/L (ref 3.5–5.1)
Sodium: 138 mmol/L (ref 135–145)
Total Bilirubin: 0.6 mg/dL (ref 0.3–1.2)
Total Protein: 6.8 g/dL (ref 6.5–8.1)

## 2019-06-18 LAB — GLUCOSE, CAPILLARY: Glucose-Capillary: 122 mg/dL — ABNORMAL HIGH (ref 70–99)

## 2019-06-18 LAB — FERRITIN: Ferritin: 1618 ng/mL — ABNORMAL HIGH (ref 24–336)

## 2019-06-18 LAB — D-DIMER, QUANTITATIVE: D-Dimer, Quant: 5.51 ug/mL-FEU — ABNORMAL HIGH (ref 0.00–0.50)

## 2019-06-18 LAB — C-REACTIVE PROTEIN: CRP: 3.9 mg/dL — ABNORMAL HIGH (ref <1.0)

## 2019-06-18 MED ORDER — METOPROLOL TARTRATE 25 MG PO TABS
25.0000 mg | ORAL_TABLET | Freq: Two times a day (BID) | ORAL | Status: DC
  Administered 2019-06-18 – 2019-06-21 (×7): 25 mg via ORAL
  Filled 2019-06-18 (×8): qty 1

## 2019-06-18 MED ORDER — INSULIN DETEMIR 100 UNIT/ML ~~LOC~~ SOLN
10.0000 [IU] | Freq: Every day | SUBCUTANEOUS | Status: DC
  Administered 2019-06-19 – 2019-06-21 (×3): 10 [IU] via SUBCUTANEOUS
  Filled 2019-06-18 (×4): qty 0.1

## 2019-06-18 MED ORDER — IOHEXOL 350 MG/ML SOLN
75.0000 mL | Freq: Once | INTRAVENOUS | Status: AC | PRN
  Administered 2019-06-18: 75 mL via INTRAVENOUS

## 2019-06-18 MED ORDER — SODIUM CHLORIDE 0.9 % IV BOLUS
500.0000 mL | Freq: Once | INTRAVENOUS | Status: AC
  Administered 2019-06-18: 500 mL via INTRAVENOUS

## 2019-06-18 MED ORDER — FLUTICASONE PROPIONATE 50 MCG/ACT NA SUSP
2.0000 | Freq: Every day | NASAL | Status: DC
  Administered 2019-06-18 – 2019-06-19 (×2): 2 via NASAL
  Filled 2019-06-18: qty 16

## 2019-06-18 MED ORDER — PSEUDOEPHEDRINE HCL 30 MG PO TABS
30.0000 mg | ORAL_TABLET | Freq: Four times a day (QID) | ORAL | Status: DC | PRN
  Administered 2019-06-18 – 2019-06-19 (×2): 30 mg via ORAL
  Filled 2019-06-18 (×3): qty 1

## 2019-06-18 MED ORDER — ENOXAPARIN SODIUM 40 MG/0.4ML ~~LOC~~ SOLN
40.0000 mg | Freq: Two times a day (BID) | SUBCUTANEOUS | Status: DC
  Administered 2019-06-18 – 2019-06-25 (×14): 40 mg via SUBCUTANEOUS
  Filled 2019-06-18 (×13): qty 0.4

## 2019-06-18 NOTE — Plan of Care (Signed)
  Problem: Education: Goal: Knowledge of risk factors and measures for prevention of condition will improve Outcome: Progressing   Problem: Coping: Goal: Psychosocial and spiritual needs will be supported Outcome: Progressing   Problem: Respiratory: Goal: Will maintain a patent airway Outcome: Progressing Goal: Complications related to the disease process, condition or treatment will be avoided or minimized Outcome: Progressing   

## 2019-06-18 NOTE — Progress Notes (Signed)
PROGRESS NOTE    Joseph Todd  YPP:[id number] DOB: 1955/12/05 DOA: 06/14/2019 PCP: Patient, No Pcp Per    Brief Narrative:   Joseph Todd is a 64 year old African-American male with past medical history remarkable for type 2 diabetes mellitus who presented to the ED with 1 week history of weakness, shortness of breath and nausea.  Patient reports dyspnea has been progressive with subjective fevers.  Denies vomiting/diarrhea, no chest pain, no abdominal pain.  Recently hospitalized a week ago with syncope, Covid-19 PCR was negative at that time.  In the ED, patient was noted to be hypoxic requiring nonrebreather to maintain adequate oxygenation.  ED PA referred for admission for Covid pneumonia with hypoxia.   Assessment & Plan:   Principal Problem:   Pneumonia due to COVID-19 virus Active Problems:   DM (diabetes mellitus) (HCC)   Acute respiratory failure due to COVID-19 [facility])   Acute hypoxic respiratory failure secondary to acute Covid-19 viral pneumonia during the ongoing 2020 Covid 19 Pandemic - POA Patient presenting with progressive shortness of breath, nausea over the past week.  Chest x-ray with multifocal patchy/groundglass opacities predominant within the lower lungs, consistent with multifocal pneumonia. --D-dimer 1.07--> <0.00-->2.95-->5.51 --CRP 23.7-->15.7-->7.4-->3.9 --Ferritin 2004-->1956-->1703-->1618 --s/p Covid-19 convalescent plasma on 2/7 --s/p Actemra 2/6 and 2/7 --Completed 5-day course of remdesivir on 06/18/2019 --decrease Solu-Medrol from 40mg  IV BID to 40mg  IV daily --Continue supplemental oxygen, titrate to maintain SPO2 greater than 92%; currently on 6 L HFNC --Continue supportive care with vitamin C, zinc, Tylenol, antitussives, combivent MDI --Follow CBC, CMP, D-dimer, ferritin, and CRP daily --Continue prone positioning when able while awake --Continue airborne/contact isolation precautions  Type 2 diabetes mellitus; poorly controlled Hemoglobin  A1c 11.9 on 06/09/2019, not well controlled.  On Metformin 500 mg p.o. daily at home. --Continue Lantus 10u [place] daily --Insulin sliding scale for further coverage; closely monitor given need of IV steroids --Diabetic educator following  Elevated D-dimer D-dimer trending up from 2.95, two 5.51 today.  Vascular duplex ultrasound negative for DVT.  Patient with sinus tachycardia. --Check CT angiogram chest to evaluate for pulmonary embolism   DVT prophylaxis: Lovenox Code Status: Full code Family Communication: Discussed with patient extensively at bedside Disposition Plan: from home, continue inpatient, PT/OT recommend home health on discharge, D-dimer trending up with sinus tachycardia, pending CT angiogram chest.  Hopeful for discharge home in the next 24-48 hours.   Consultants:   None  Procedures:   None  Covid-19 treatment:   Remdesivir 2/5>>  Actemra 2/6, 2/7  Decadron 2/5 - 2/6  Solu-Medrol 2/6>>  Convalescent plasma 2/7   Subjective: Patient seen and examined at bedside, lying comfortably in bed.  Titrated down to room air today at rest.  Continues with sinus tachycardia and elevating D-dimer, will check CT angiogram chest.  Vascular duplex ultrasound bilateral lower extremities negative for DVT. No other specific complaints at this time.  Denies headache, no fever/chills/night sweats, no vomiting/diarrhea, no chest pain, palpitations, no abdominal pain.  No acute events overnight per nursing staff.  Objective: Vitals:   06/18/19 0954 06/18/19 1200 06/18/19 1232 06/18/19 1236  BP: 134/84  112/60   Pulse: (!) 112  (!) 112   Resp: (!) 24  (!) 22   Temp: 97.9 F (36.6 C)     TempSrc: Oral     SpO2: 91% (!) 88% 90% (!) 88%  Weight:      Height:        Intake/Output Summary (Last 24 hours) at 06/18/2019 1324 Last data  filed at 06/18/2019 1033 Gross per 24 hour  Intake --  Output 990 ml  Net -990 ml   Filed Weights   06/14/19 1801 06/15/19 0124  Weight: 80.7  kg 79.9 kg    Examination:  General exam: Appears calm and comfortable  Respiratory system: Clear to auscultation.  Slightly increased respiratory effort, on room air oxygenating 98%. Cardiovascular system: S1 & S2 heard, RRR. No JVD, murmurs, rubs, gallops or clicks. No pedal edema. Gastrointestinal system: Abdomen is nondistended, soft and nontender. No organomegaly or masses felt. Normal bowel sounds heard. Central nervous system: Alert and oriented. No focal neurological deficits. Extremities: Symmetric 5 x 5 power. Skin: No rashes, lesions or ulcers Psychiatry: Judgement and insight appear normal. Mood & affect appropriate.     Data Reviewed: I have personally reviewed following labs and imaging studies  CBC: Recent Labs  Lab 06/14/19 1825 06/15/19 0450 06/16/19 0319 06/17/19 0306 06/18/19 0405  WBC 8.3 8.7 8.3 9.4 8.1  NEUTROABS 6.6 7.0 6.2 7.1 4.9  HGB 15.0 14.8 14.6 14.1 15.1  HCT 44.9 46.9 45.7 44.2 47.3  MCV 88.2 90.9 90.3 90.6 89.9  PLT 274 339 368 383 363   Basic Metabolic Panel: Recent Labs  Lab 06/14/19 1825 06/15/19 0450 06/16/19 0319 06/17/19 0306 06/18/19 0405  NA 132* 133* 138 137 138  K 4.2 5.0 5.0 5.0 4.6  CL 96* 94* 102 101 102  CO2 25 23 27 26 27   GLUCOSE 212* 229* 225* 198* 85  BUN 15 15 22 22 22   CREATININE 1.04 1.19 1.12 0.99 1.14  CALCIUM 8.2* 7.9* 8.5* 8.8* 8.7*   GFR: Estimated Creatinine Clearance: 65.8 mL/min (by C-G formula based on SCr of 1.14 mg/dL). Liver Function Tests: Recent Labs  Lab 06/14/19 1825 06/15/19 0450 06/16/19 0319 06/17/19 0306 06/18/19 0405  AST 48* 50* 44* 53* 61*  ALT 56* 54* 48* 57* 69*  ALKPHOS 58 61 67 78 82  BILITOT 1.3* 1.2 0.9 0.6 0.6  PROT 7.1 7.3 6.9 7.2 6.8  ALBUMIN 2.7* 2.5* 2.6* 2.8* 2.6*   No results for input(s): LIPASE, AMYLASE in the last 168 hours. No results for input(s): AMMONIA in the last 168 hours. Coagulation Profile: No results for input(s): INR, PROTIME in the last 168  hours. Cardiac Enzymes: No results for input(s): CKTOTAL, CKMB, CKMBINDEX, TROPONINI in the last 168 hours. BNP (last 3 results) No results for input(s): PROBNP in the last 8760 hours. HbA1C: No results for input(s): HGBA1C in the last 72 hours. CBG: Recent Labs  Lab 06/15/19 0036 06/15/19 0800 06/15/19 1118 06/15/19 1645 06/18/19 1149  GLUCAP 216* 194* 282* 288* 122*   Lipid Profile: No results for input(s): CHOL, HDL, LDLCALC, TRIG, CHOLHDL, LDLDIRECT in the last 72 hours. Thyroid Function Tests: No results for input(s): TSH, T4TOTAL, FREET4, T3FREE, THYROIDAB in the last 72 hours. Anemia Panel: Recent Labs    06/17/19 0306 06/18/19 0405  FERRITIN 1,703* 1,618*   Sepsis Labs: Recent Labs  Lab 06/15/19 0450  PROCALCITON 0.57    Recent Results (from the past 240 hour(s))  Respiratory Panel by RT PCR (Flu A&B, Covid) - Nasopharyngeal Swab     Status: None   Collection Time: 06/09/19  5:37 AM   Specimen: Nasopharyngeal Swab  Result Value Ref Range Status   SARS Coronavirus 2 by RT PCR NEGATIVE NEGATIVE Final    Comment: (NOTE) SARS-CoV-2 target nucleic acids are NOT DETECTED. The SARS-CoV-2 RNA is generally detectable in upper respiratoy specimens during the acute phase  of infection. The lowest concentration of SARS-CoV-2 viral copies this assay can detect is 131 copies/mL. A negative result does not preclude SARS-Cov-2 infection and should not be used as the sole basis for treatment or other patient management decisions. A negative result may occur with  improper specimen collection/handling, submission of specimen other than nasopharyngeal swab, presence of viral mutation(s) within the areas targeted by this assay, and inadequate number of viral copies (<131 copies/mL). A negative result must be combined with clinical observations, patient history, and epidemiological information. The expected result is Negative. Fact Sheet for Patients:   PinkCheek.be Fact Sheet for Healthcare Providers:  GravelBags.it This test is not yet ap proved or cleared by the [place] FDA and  has been authorized for detection and/or diagnosis of SARS-CoV-2 by FDA under an Emergency Use Authorization (EUA). This EUA will remain  in effect (meaning this test can be used) for the duration of the COVID-19 declaration under Section 564(b)(1) of the Act, 21 U.S.C. section 360bbb-3(b)(1), unless the authorization is terminated or revoked sooner.    Influenza A by PCR NEGATIVE NEGATIVE Final   Influenza B by PCR NEGATIVE NEGATIVE Final    Comment: (NOTE) The Xpert Xpress SARS-CoV-2/FLU/RSV assay is intended as an aid in  the diagnosis of influenza from Nasopharyngeal swab specimens and  should not be used as a sole basis for treatment. Nasal washings and  aspirates are unacceptable for Xpert Xpress SARS-CoV-2/FLU/RSV  testing. Fact Sheet for Patients: PinkCheek.be Fact Sheet for Healthcare Providers: GravelBags.it This test is not yet approved or cleared by the [place] FDA and  has been authorized for detection and/or diagnosis of SARS-CoV-2 by  FDA under an Emergency Use Authorization (EUA). This EUA will remain  in effect (meaning this test can be used) for the duration of the  Covid-19 declaration under Section 564(b)(1) of the Act, 21  U.S.C. section 360bbb-3(b)(1), unless the authorization is  terminated or revoked. Performed at [facility], [place] [address] [place],  [address]   Respiratory Panel by RT PCR (Flu A&B, Covid) - Nasopharyngeal Swab     Status: Abnormal   Collection Time: 06/14/19  6:25 PM   Specimen: Nasopharyngeal Swab  Result Value Ref Range Status   SARS Coronavirus 2 by RT PCR POSITIVE (A) NEGATIVE Final    Comment: RESULT CALLED TO, READ BACK BY AND VERIFIED  WITH: B.BROOKS AT 2054 ON 06/14/19 BY N.THOMPSON (NOTE) SARS-CoV-2 target nucleic acids are DETECTED. SARS-CoV-2 RNA is generally detectable in upper respiratory specimens  during the acute phase of infection. Positive results are indicative of the presence of the identified virus, but do not rule out bacterial infection or co-infection with other pathogens not detected by the test. Clinical correlation with patient history and other diagnostic information is necessary to determine patient infection status. The expected result is Negative. Fact Sheet for Patients:  PinkCheek.be Fact Sheet for Healthcare Providers: GravelBags.it This test is not yet approved or cleared by the [place] FDA and  has been authorized for detection and/or diagnosis of SARS-CoV-2 by FDA under an Emergency Use Authorization (EUA).  This EUA will remain in effect (meaning this test can be [place] ed) for the duration of  the COVID-19 declaration under Section 564(b)(1) of the Act, 21 U.S.C. section 360bbb-3(b)(1), unless the authorization is terminated or revoked sooner.    Influenza A by PCR NEGATIVE NEGATIVE Final   Influenza B by PCR NEGATIVE NEGATIVE Final    Comment: (NOTE) The Xpert Xpress  SARS-CoV-2/FLU/RSV assay is intended as an aid in  the diagnosis of influenza from Nasopharyngeal swab specimens and  should not be used as a sole basis for treatment. Nasal washings and  aspirates are unacceptable for Xpert Xpress SARS-CoV-2/FLU/RSV  testing. Fact Sheet for Patients: https://www.moore.com/ Fact Sheet for Healthcare Providers: https://www.young.biz/ This test is not yet approved or cleared by the [place] FDA and  has been authorized for detection and/or diagnosis of SARS-CoV-2 by  FDA under an Emergency Use Authorization (EUA). This EUA will remain  in effect (meaning this test can be used) for the  duration of the  Covid-19 declaration under Section 564(b)(1) of the Act, 21  U.S.C. section 360bbb-3(b)(1), unless the authorization is  terminated or revoked. Performed at [facility], [address] [place], [place] [address]          Radiology Studies: VAS [place] LOWER EXTREMITY VENOUS (DVT)  Result Date: 06/18/2019  Lower Venous DVT Study Indications: Edema.  Risk Factors: COVID 19 positive. Limitations: Poor ultrasound/tissue interface. Comparison Study: No prior studies. Performing Technologist: Chanda Busing RVT  Examination Guidelines: A complete evaluation includes B-mode imaging, spectral Doppler, color Doppler, and power Doppler as needed of all accessible portions of each vessel. Bilateral testing is considered an integral part of a complete examination. Limited examinations for reoccurring indications may be performed as noted. The reflux portion of the exam is performed with the patient in reverse Trendelenburg.  +---------+---------------+---------+-----------+----------+--------------+ RIGHT    CompressibilityPhasicitySpontaneityPropertiesThrombus Aging +---------+---------------+---------+-----------+----------+--------------+ CFV      Full           Yes      Yes                                 +---------+---------------+---------+-----------+----------+--------------+ SFJ      Full                                                        +---------+---------------+---------+-----------+----------+--------------+ FV Prox  Full                                                        +---------+---------------+---------+-----------+----------+--------------+ FV Mid   Full                                                        +---------+---------------+---------+-----------+----------+--------------+ FV DistalFull                                                         +---------+---------------+---------+-----------+----------+--------------+ PFV      Full                                                        +---------+---------------+---------+-----------+----------+--------------+  POP      Full           Yes      Yes                                 +---------+---------------+---------+-----------+----------+--------------+ PTV      Full                                                        +---------+---------------+---------+-----------+----------+--------------+ PERO     Full                                                        +---------+---------------+---------+-----------+----------+--------------+   +---------+---------------+---------+-----------+----------+--------------+ LEFT     CompressibilityPhasicitySpontaneityPropertiesThrombus Aging +---------+---------------+---------+-----------+----------+--------------+ CFV      Full           Yes      Yes                                 +---------+---------------+---------+-----------+----------+--------------+ SFJ      Full                                                        +---------+---------------+---------+-----------+----------+--------------+ FV Prox  Full                                                        +---------+---------------+---------+-----------+----------+--------------+ FV Mid   Full                                                        +---------+---------------+---------+-----------+----------+--------------+ FV DistalFull                                                        +---------+---------------+---------+-----------+----------+--------------+ PFV      Full                                                        +---------+---------------+---------+-----------+----------+--------------+ POP      Full           Yes      Yes                                  +---------+---------------+---------+-----------+----------+--------------+  PTV      Full                                                        +---------+---------------+---------+-----------+----------+--------------+ PERO     Full                                                        +---------+---------------+---------+-----------+----------+--------------+     Summary: RIGHT: - There is no evidence of deep vein thrombosis in the lower extremity.  - No cystic structure found in the popliteal fossa.  LEFT: - There is no evidence of deep vein thrombosis in the lower extremity.  - No cystic structure found in the popliteal fossa.  *See table(s) above for measurements and observations. Electronically signed by Waverly Ferrari MD on 06/18/2019 at 12:13:03 PM.    Final         Scheduled Meds: . vitamin C  500 mg Oral Daily  . enoxaparin (LOVENOX) injection  40 mg Subcutaneous Q24H  . fluticasone  2 spray Each Nare Daily  . insulin aspart  0-5 Units Subcutaneous QHS  . insulin aspart  0-9 Units Subcutaneous TID WC  . [START ON 06/19/2019] insulin detemir  10 Units Subcutaneous QHS  . Ipratropium-Albuterol  1 puff Inhalation Q6H  . linagliptin  5 mg Oral Daily  . methylPREDNISolone (SOLU-MEDROL) injection  40 mg Intravenous Daily  . senna-docusate  2 tablet Oral BID  . sodium chloride flush  3 mL Intravenous Q12H  . zinc sulfate  220 mg Oral Daily   Continuous Infusions: . sodium chloride       LOS: 4 days    Time spent: 36 minutes spent on chart review, discussion with nursing staff, consultants, updating family and interview/physical exam; more than 50% of that time was spent in counseling and/or coordination of care.    Alvira Philips [place], DO [facility] Available via Epic secure chat 7am-7pm After these hours, please refer to coverage provider listed on [email or web address] 06/18/2019, 1:24 PM

## 2019-06-18 NOTE — Progress Notes (Signed)
Inpatient Diabetes Program Recommendations  AACE/ADA: New Consensus Statement on Inpatient Glycemic Control (2015)  Target Ranges:  Prepandial:   less than 140 mg/dL      Peak postprandial:   less than 180 mg/dL (1-2 hours)      Critically ill patients:  140 - 180 mg/dL   Lab Results  Component Value Date   GLUCAP 122 (H) 06/18/2019   HGBA1C 11.9 (H) 06/09/2019    Review of Glycemic Control  Diabetes history: DM 2 Outpatient Diabetes medications: metformin 500 mg Daily Current orders for Inpatient glycemic control:  Levemir 8 units bid Novolog 4 unit tid meal coverage if eats 50% Novolog 0-9 units tid + hs Tradjenta 5 mg qd  Solumedrol 40 mg Q24 hours  Inpatient Diabetes Program Recommendations:   D/C Novolog meal coverage with reduction in steroids by 50%. Secure chat to Dr. [place] with recommendation.  Thank you, Billy Fischer. Ryane Canavan, RN, MSN, CDE  Diabetes Coordinator Inpatient Glycemic Control Team Team Pager [phone] (8am-5pm) 06/18/2019 1:14 PM

## 2019-06-18 NOTE — Plan of Care (Signed)
Plan of Care reviewed. 

## 2019-06-18 NOTE — Progress Notes (Signed)
Bilateral lower extremity venous duplex has been completed. Preliminary results can be found in CV Proc through chart review.   06/18/19 9:47 AM Olen Cordial RVT

## 2019-06-18 NOTE — Progress Notes (Signed)
Pt tachycardic at baseline, RR fluctuating 20's-50's. Temp reduced d/t drinking ice water. Pt in no distress. No acute change.  Will continue to monitor.

## 2019-06-18 NOTE — Progress Notes (Signed)
Physical Therapy Treatment Patient Details Name: Alias Villagran MRN: [id number] DOB: Mar 20, 1956 Today's Date: 06/18/2019    History of Present Illness Dorsey Authement is a 64 year old African-American male with past medical history remarkable for type 2 diabetes mellitus who presented to the ED with 1 week history of weakness, shortness of breath and nausea.  Patient reports dyspnea has been progressive with subjective fevers.  Denies vomiting/diarrhea, no chest pain, no abdominal pain. Recently hospitalized a week ago with syncope, Covid-19 PCR was negative at that time. In the ED, patient was noted to be hypoxic requiring nonrebreather to maintain adequate oxygenation.  ED PA referred for admission for Covid pneumonia with hypoxia.    PT Comments    RN requested ambulation in hallway with monitoring. He was resting in bed when PT arrived. Reminded him of pursed lip breathing, IS and Flutter Valve unit. Explained that we were going to walk in hallway. He was on RA at beginning of session, but O2 sats were hovering at 86-88% and HR was increasing. RN agreed to put him on Oxygen- and at 2 LPM, [place] his HR dropped into 110, and O2 increased to 95%. During the ambulation and within 50 feet of his room, his HR suddenly increased to 151 and increased his O2 to 4 LPM to safely get him to his room - RN notified immediately- Within 5 min , HR dropped to 119 and O2 sats were 88-89%. He preferred to rest, and was left lying on his left side in bed- bed in low position. Call light in reach along with remote, and telephone. Covers in place. Discussed with RN and he was left on 3 LPM, [place] of O2. Should continue to benefit from PT to further monitor and progress in goals as able for optimal functional outcomes.   Follow Up Recommendations  Home health PT     Equipment Recommendations       Recommendations for Other Services       Precautions / Restrictions Precautions Precautions: Fall Precaution Comments: Monitor  SpO2 and HR, tends to run high on RR as he is abdominal breather. Restrictions Weight Bearing Restrictions: No Other Position/Activity Restrictions: Monitor closely as noted above related to O2 sats and HR..    Mobility  Bed Mobility Overal bed mobility: Modified Independent Bed Mobility: Supine to Sit     Supine to sit: Supervision;HOB elevated     General bed mobility comments: Use of bedrail. Supervision for line management and safety  Transfers Overall transfer level: Needs assistance Equipment used: None Transfers: Sit to/from Stand Sit to Stand: Supervision         General transfer comment: Supervision to ensure safety and for line management.   Ambulation/Gait Ambulation/Gait assistance: Min guard(monitoring for hallway ambulation - Portable telemetry- O2, and Portable O2 tank.) Gait Distance (Feet): 200 Feet Assistive device: None Gait Pattern/deviations: Narrow base of support;Shuffle   Gait velocity interpretation: 1.31 - 2.62 ft/sec, indicative of limited community ambulator(within 50 feet o fhis room, he began to complain of shortness of breath and HR escalated to 151- RN notified- once in room, within 5 min he recovered to 119 HR and steady 88% of SPO2)     Stairs             Wheelchair Mobility    Modified Rankin (Stroke Patients Only)       Balance Overall balance assessment: Mild deficits observed, not formally tested  Cognition Arousal/Alertness: Awake/alert Behavior During Therapy: WFL for tasks assessed/performed Overall Cognitive Status: Within Functional Limits for tasks assessed                                 General Comments: Very pleasant, receptive to PT      Exercises Other Exercises Other Exercises: Reminded of pursed lip breathing with fairly good return demo. Other Exercises: Reminded to perform ankle pumps frequently both in seated and while in  bed.    General Comments General comments (skin integrity, edema, etc.): Ambulated in hallway at request of RN- monitored throughout. He did desat briefly to 79%, but applied Oxygen at 4 LPM and recovered to 90% in < 5 minutes. Tends to run high HR- monitor closely. He normally fluctuates on high RR.      Pertinent Vitals/Pain Pain Assessment: No/denies pain    Home Living                      Prior Function            PT Goals (current goals can now be found in the care plan section) Acute Rehab PT Goals Patient Stated Goal: to go home PT Goal Formulation: With patient Time For Goal Achievement: 06/30/19 Potential to Achieve Goals: Good Progress towards PT goals: Progressing toward goals    Frequency    Min 3X/week      PT Plan      Co-evaluation              AM-PAC PT "6 Clicks" Mobility   Outcome Measure  Help needed turning from your back to your side while in a flat bed without using bedrails?: None Help needed moving from lying on your back to sitting on the side of a flat bed without using bedrails?: A Little Help needed moving to and from a bed to a chair (including a wheelchair)?: A Little Help needed standing up from a chair using your arms (e.g., wheelchair or bedside chair)?: A Little Help needed to walk in hospital room?: A Little Help needed climbing 3-5 steps with a railing? : A Little 6 Click Score: 19    End of Session   Activity Tolerance: Treatment limited secondary to medical complications (Comment)(Ambulated for 150 with SPO2 briefly desatted to 79 but quickly recovered to 88%- then HR suddenly escalated to 151. RN notified and patient was returned to room- recovered to HR 119,- Left him on 3 LPM [place] which RN agreed with to allow him to rest.) Patient left: in bed;with call bell/phone within reach Nurse Communication: Mobility status PT Visit Diagnosis: Muscle weakness (generalized) (M62.81);Unsteadiness on feet (R26.81)      Time: 2831-5176 PT Time Calculation (min) (ACUTE ONLY): 40 min  Charges:  $Gait Training: 8-22 mins $Therapeutic Exercise: 8-22 mins $Therapeutic Activity: 8-22 mins                    [name], PT # [phone] CGV cell   Ephriam Jenkins 06/18/2019, 12:58 PM

## 2019-06-19 DIAGNOSIS — J9600 Acute respiratory failure, unspecified whether with hypoxia or hypercapnia: Secondary | ICD-10-CM

## 2019-06-19 DIAGNOSIS — E119 Type 2 diabetes mellitus without complications: Secondary | ICD-10-CM

## 2019-06-19 LAB — CBC WITH DIFFERENTIAL/PLATELET
Abs Immature Granulocytes: 0.64 10*3/uL — ABNORMAL HIGH (ref 0.00–0.07)
Basophils Absolute: 0 10*3/uL (ref 0.0–0.1)
Basophils Relative: 0 %
Eosinophils Absolute: 0.2 10*3/uL (ref 0.0–0.5)
Eosinophils Relative: 3 %
HCT: 47.6 % (ref 39.0–52.0)
Hemoglobin: 15.2 g/dL (ref 13.0–17.0)
Immature Granulocytes: 9 %
Lymphocytes Relative: 24 %
Lymphs Abs: 1.7 10*3/uL (ref 0.7–4.0)
MCH: 28.6 pg (ref 26.0–34.0)
MCHC: 31.9 g/dL (ref 30.0–36.0)
MCV: 89.6 fL (ref 80.0–100.0)
Monocytes Absolute: 0.3 10*3/uL (ref 0.1–1.0)
Monocytes Relative: 4 %
Neutro Abs: 4.4 10*3/uL (ref 1.7–7.7)
Neutrophils Relative %: 60 %
Platelets: 273 10*3/uL (ref 150–400)
RBC: 5.31 MIL/uL (ref 4.22–5.81)
RDW: 13.1 % (ref 11.5–15.5)
WBC: 7.4 10*3/uL (ref 4.0–10.5)
nRBC: 1.1 % — ABNORMAL HIGH (ref 0.0–0.2)

## 2019-06-19 LAB — COMPREHENSIVE METABOLIC PANEL
ALT: 61 U/L — ABNORMAL HIGH (ref 0–44)
AST: 49 U/L — ABNORMAL HIGH (ref 15–41)
Albumin: 2.6 g/dL — ABNORMAL LOW (ref 3.5–5.0)
Alkaline Phosphatase: 81 U/L (ref 38–126)
Anion gap: 8 (ref 5–15)
BUN: 18 mg/dL (ref 8–23)
CO2: 28 mmol/L (ref 22–32)
Calcium: 8.5 mg/dL — ABNORMAL LOW (ref 8.9–10.3)
Chloride: 100 mmol/L (ref 98–111)
Creatinine, Ser: 1 mg/dL (ref 0.61–1.24)
GFR calc Af Amer: >60 mL/min (ref >60)
GFR calc non Af Amer: >60 mL/min (ref >60)
Glucose, Bld: 122 mg/dL — ABNORMAL HIGH (ref 70–99)
Potassium: 4.5 mmol/L (ref 3.5–5.1)
Sodium: 136 mmol/L (ref 135–145)
Total Bilirubin: 0.9 mg/dL (ref 0.3–1.2)
Total Protein: 6.3 g/dL — ABNORMAL LOW (ref 6.5–8.1)

## 2019-06-19 LAB — C-REACTIVE PROTEIN: CRP: 1.9 mg/dL — ABNORMAL HIGH (ref <1.0)

## 2019-06-19 LAB — D-DIMER, QUANTITATIVE: D-Dimer, Quant: >20 ug/mL-FEU — ABNORMAL HIGH (ref 0.00–0.50)

## 2019-06-19 LAB — FERRITIN: Ferritin: 1397 ng/mL — ABNORMAL HIGH (ref 24–336)

## 2019-06-19 MED ORDER — INSULIN STARTER KIT- PEN NEEDLES (ENGLISH)
1.0000 | Freq: Once | Status: AC
  Administered 2019-06-19: 1
  Filled 2019-06-19: qty 1

## 2019-06-19 MED ORDER — FLUTICASONE PROPIONATE 50 MCG/ACT NA SUSP
2.0000 | Freq: Two times a day (BID) | NASAL | Status: DC
  Administered 2019-06-19 – 2019-06-25 (×11): 2 via NASAL
  Filled 2019-06-19: qty 16

## 2019-06-19 MED ORDER — HYDROCOD POLST-CPM POLST ER 10-8 MG/5ML PO SUER
5.0000 mL | Freq: Two times a day (BID) | ORAL | Status: DC
  Administered 2019-06-19 – 2019-06-25 (×13): 5 mL via ORAL
  Filled 2019-06-19 (×13): qty 5

## 2019-06-19 MED ORDER — DEXAMETHASONE SODIUM PHOSPHATE 10 MG/ML IJ SOLN
6.0000 mg | Freq: Every day | INTRAMUSCULAR | Status: DC
  Administered 2019-06-20 – 2019-06-23 (×4): 6 mg via INTRAVENOUS
  Filled 2019-06-19 (×4): qty 1

## 2019-06-19 MED ORDER — GUAIFENESIN-DM 100-10 MG/5ML PO SYRP
10.0000 mL | ORAL_SOLUTION | ORAL | Status: DC | PRN
  Administered 2019-06-22 – 2019-06-23 (×2): 10 mL via ORAL
  Filled 2019-06-19 (×2): qty 10

## 2019-06-19 MED ORDER — FUROSEMIDE 10 MG/ML IJ SOLN
20.0000 mg | Freq: Once | INTRAMUSCULAR | Status: AC
  Administered 2019-06-19: 12:00:00 20 mg via INTRAVENOUS
  Filled 2019-06-19: qty 2

## 2019-06-19 MED ORDER — SALINE SPRAY 0.65 % NA SOLN
1.0000 | Freq: Four times a day (QID) | NASAL | Status: DC | PRN
  Administered 2019-06-19: 1 via NASAL
  Filled 2019-06-19: qty 44

## 2019-06-19 NOTE — Progress Notes (Addendum)
PROGRESS NOTE    Joseph Todd  NLG:[id number] DOB: 1955/09/03 DOA: 06/14/2019 PCP: Patient, No Pcp Per    Brief Narrative:  Patient was admitted to the hospital with a working diagnosis of acute hypoxic respiratory failure due to SARS COVID-19 viral pneumonia  64 year old male who presented with dyspnea.  He does have significant past medical history for type 2 diabetes mellitus.  He reported 1 week of dyspnea, generalized weakness and nausea, associated with subjective fevers.  Physical examination he was in respiratory distress, respiratory rate 34, heart rate 101, oxygen saturation 86%, blood pressure 116/80.  His lungs are clear to auscultation, no wheezing or rhonchi, heart S1-S2 present and rhythmic, soft abdomen, no lower extremity edema.  SARS COVID-19 positive. Chest radiograph with bilateral interstitial infiltrates at bases.  CT chest negative for pulmonary embolism, bilateral subpleural groundglass opacities, positive subpleural blebs.  EKG 110 bpm, normal axis, normal intervals, sinus rhythm, no ST segment or T wave changes.  Patient has received supplemental oxygen per high flow nasal cannula, systemic or steroids, COVID-19 convalescent plasma and Tocilizumab x2.   Oxygen requirements have been improving.   Assessment & Plan:   Principal Problem:   Pneumonia due to COVID-19 virus Active Problems:   DM (diabetes mellitus) (HCC)   Acute respiratory failure due to COVID-19 (HCC)   1. Acute hypoxic respiratory failure due to SARS COVID 19 viral pneumonia. Sp #5/5 remdesivir, tocilizumab x2 (02/06 and 02/07), covid 19 convalescent plasma 02/07.   RR: 21  Pulse oxymetry: 93%  Fi02: 2 L/ min per [place].  COVID-19 Labs  Recent Labs    06/17/19 0306 06/18/19 0405 06/19/19 0330  DDIMER 2.95* 5.51* >20.00*  FERRITIN 1,703* 1,618* 1,397*  CRP 7.4* 3.9* 1.9*    Lab Results  Component Value Date   SARSCOV2NAA POSITIVE (A) 06/14/2019   SARSCOV2NAA NEGATIVE 06/09/2019    Patient with D dimer more than 20 today. Has ruled out for PE and DVT. Clinically with signs of volume overload.  Will continue medical therapy with systemic corticosteroids dexamethasone 6 mg IV q 24, antitussive agents, bronchodilators and airway clearing techniques with flutter valve and incentive spirometer. \  Will do a trial of diuresis with furosemide, follow fluid balance. Continue high dose of prophylactic enoxaparin. Out of bed to chair tid with meals, physical and occupational therapy.   2. Uncontrolled T2DM (Hgb A1c 11,9) with steroid induced hyperglycemia. FAsting glucose 122. Will contiue basal insulin and insulin sliding scale for glucose cover and monitoring. Patient on linagliptin.    DVT prophylaxis: enoxaparin   Code Status:  full Family Communication: I spoke over the phone with the patient's wife  about patient's  condition, plan of care, prognosis and all questions were addressed. Disposition Plan/ discharge barriers: possible dc in am if oxygen saturation more than 88 on less than 5 L/ min per [place]. Transfer to medical ward discontinue telemetry.    Subjective: Patient with non specific chest pain, right sided, constant, no pleuritic, no nausea or vomiting, dyspnea improved but not yet back to baseline.   Objective: Vitals:   06/18/19 1836 06/18/19 1910 06/19/19 0035 06/19/19 0756  BP:  107/68 (!) 139/96 137/87  Pulse: (!) 112 (!) 119 (!) 116 (!) 104  Resp: (!) 24 (!) 22 (!) 22 (!) 21  Temp:  98 F (36.7 C) 97.8 F (36.6 C) (!) 97.4 F (36.3 C)  TempSrc:  Oral Oral Oral  SpO2: 97% 94% (!) 84% 93%  Weight:      Height:  Intake/Output Summary (Last 24 hours) at 06/19/2019 0900 Last data filed at 06/19/2019 0400 Gross per 24 hour  Intake 500 ml  Output 1250 ml  Net -750 ml   Filed Weights   06/14/19 1801 06/15/19 0124  Weight: 80.7 kg 79.9 kg    Examination:   General: Not in pain or dyspnea, deconditioned  Neurology: Awake and alert, non  focal  E ENT: mild pallor, no icterus, oral mucosa moist Cardiovascular: No JVD. S1-S2 present, rhythmic. No lower extremity edema. Pulmonary: positive breath sounds bilaterally. Gastrointestinal. Abdomen with, no organomegaly, non tender, no rebound or guarding Skin. No rashes Musculoskeletal: no joint deformities     Data Reviewed: I have personally reviewed following labs and imaging studies  CBC: Recent Labs  Lab 06/15/19 0450 06/16/19 0319 06/17/19 0306 06/18/19 0405 06/19/19 0330  WBC 8.7 8.3 9.4 8.1 7.4  NEUTROABS 7.0 6.2 7.1 4.9 4.4  HGB 14.8 14.6 14.1 15.1 15.2  HCT 46.9 45.7 44.2 47.3 47.6  MCV 90.9 90.3 90.6 89.9 89.6  PLT 339 368 383 363 161   Basic Metabolic Panel: Recent Labs  Lab 06/15/19 0450 06/16/19 0319 06/17/19 0306 06/18/19 0405 06/19/19 0330  NA 133* 138 137 138 136  K 5.0 5.0 5.0 4.6 4.5  CL 94* 102 101 102 100  CO2 23 27 26 27 28   GLUCOSE 229* 225* 198* 85 122*  BUN 15 22 22 22 18   CREATININE 1.19 1.12 0.99 1.14 1.00  CALCIUM 7.9* 8.5* 8.8* 8.7* 8.5*   GFR: Estimated Creatinine Clearance: 75.1 mL/min (by C-G formula based on SCr of 1 mg/dL). Liver Function Tests: Recent Labs  Lab 06/15/19 0450 06/16/19 0319 06/17/19 0306 06/18/19 0405 06/19/19 0330  AST 50* 44* 53* 61* 49*  ALT 54* 48* 57* 69* 61*  ALKPHOS 61 67 78 82 81  BILITOT 1.2 0.9 0.6 0.6 0.9  PROT 7.3 6.9 7.2 6.8 6.3*  ALBUMIN 2.5* 2.6* 2.8* 2.6* 2.6*   No results for input(s): LIPASE, AMYLASE in the last 168 hours. No results for input(s): AMMONIA in the last 168 hours. Coagulation Profile: No results for input(s): INR, PROTIME in the last 168 hours. Cardiac Enzymes: No results for input(s): CKTOTAL, CKMB, CKMBINDEX, TROPONINI in the last 168 hours. BNP (last 3 results) No results for input(s): PROBNP in the last 8760 hours. HbA1C: No results for input(s): HGBA1C in the last 72 hours. CBG: Recent Labs  Lab 06/15/19 0036 06/15/19 0800 06/15/19 1118  06/15/19 1645 06/18/19 1149  GLUCAP 216* 194* 282* 288* 122*   Lipid Profile: No results for input(s): CHOL, HDL, LDLCALC, TRIG, CHOLHDL, LDLDIRECT in the last 72 hours. Thyroid Function Tests: No results for input(s): TSH, T4TOTAL, FREET4, T3FREE, THYROIDAB in the last 72 hours. Anemia Panel: Recent Labs    06/18/19 0405 06/19/19 0330  FERRITIN 1,618* 1,397*      Radiology Studies: I have reviewed all of the imaging during this hospital visit personally     Scheduled Meds: . vitamin C  500 mg Oral Daily  . enoxaparin (LOVENOX) injection  40 mg Subcutaneous BID  . fluticasone  2 spray Each Nare Daily  . insulin aspart  0-5 Units Subcutaneous QHS  . insulin aspart  0-9 Units Subcutaneous TID WC  . insulin detemir  10 Units Subcutaneous QHS  . Ipratropium-Albuterol  1 puff Inhalation Q6H  . linagliptin  5 mg Oral Daily  . methylPREDNISolone (SOLU-MEDROL) injection  40 mg Intravenous Daily  . metoprolol tartrate  25 mg Oral BID  .  senna-docusate  2 tablet Oral BID  . sodium chloride flush  3 mL Intravenous Q12H  . zinc sulfate  220 mg Oral Daily   Continuous Infusions: . sodium chloride       LOS: 5 days        Bobby Barton Annett Gula, MD

## 2019-06-19 NOTE — Plan of Care (Signed)
Plan of Care reviewed. 

## 2019-06-19 NOTE — Progress Notes (Signed)
Message to MD: Pt reports tightness in chest, onset around 0300.  Bil shoulder "soreness" since fall x1 week ago.  HR 101, RR 21, O2 93% on 2LNC.  Pt consistently tachypneic and tachycardic x6 days.  Today's v/s improvement.  Chest tightness is new.  D-dimer >20.  CTa negative for PE, bil U/S of lower extremities negative for DVT.

## 2019-06-19 NOTE — Plan of Care (Signed)
  Problem: Education: Goal: Knowledge of risk factors and measures for prevention of condition will improve Outcome: Progressing   Problem: Coping: Goal: Psychosocial and spiritual needs will be supported Outcome: Progressing   Problem: Respiratory: Goal: Will maintain a patent airway Outcome: Progressing Goal: Complications related to the disease process, condition or treatment will be avoided or minimized Outcome: Progressing   

## 2019-06-19 NOTE — Progress Notes (Signed)
Inpatient Diabetes Program Recommendations  AACE/ADA: New Consensus Statement on Inpatient Glycemic Control (2015)  Target Ranges:  Prepandial:   less than 140 mg/dL      Peak postprandial:   less than 180 mg/dL (1-2 hours)      Critically ill patients:  140 - 180 mg/dL   Lab Results  Component Value Date   GLUCAP 122 (H) 06/18/2019   HGBA1C 11.9 (H) 06/09/2019   Spoke with patient regarding outpatient diabetes management. Verified home medications.  Reviewed patient's current A1c of 11.9%. Explained what a A1c is and what it measures. Also reviewed goal A1c with patient, importance of good glucose control @ home, and blood sugar goals. Reviewed patho of DM, need for insulin, impact of covid and steroids on glucose trends, current inpatient trends in the setting of solumedrol, vascular changes and commorbidities. Patient has a meter and supplies, however, was seldomly using them to check blood sugars. Encouraged to restart checking 2-3 times per day at discharge. Denies drinking sugary beverages. Reviewed mindfulness of carb intake, plate methods and alternatives. Reviewed the possibility of insulin with patient at discharge and encouraged that he begin learning self injection. Rn to help review self injection and ordered insulin starter kit as I anticipate insulin at discharge, especially if steroids are continued.  Patient has no further quesitons at this time and plans to follow up with PCP following discharge.  Thanks, Bronson Curb, MSN, RNC-OB Diabetes Coordinator [phone] (8a-5p)

## 2019-06-19 NOTE — Progress Notes (Signed)
Physical Therapy Treatment Patient Details Name: Joseph Todd MRN: [id number] DOB: [birth year] Today's Date: 06/19/2019    History of Present Illness Joseph Todd is a 64 year old African-American male with past medical history remarkable for type 2 diabetes mellitus who presented to the ED with 1 week history of weakness, shortness of breath and nausea.  Patient reports dyspnea has been progressive with subjective fevers.  Denies vomiting/diarrhea, no chest pain, no abdominal pain. Recently hospitalized a week ago with syncope, Covid-19 PCR was negative at that time. In the ED, patient was noted to be hypoxic requiring nonrebreather to maintain adequate oxygenation.  ED PA referred for admission for Covid pneumonia with hypoxia.    PT Comments    Patient lying in bed with PT entered room. Stated that he had been up in chair for a few hours earlier, but really needed to rest- agreed to ex format. He stated he was "so happy that he might be going home in a couple of days", but that he did not want to get too excited and "something happen". Maintained 90%+ during PT session- practiced pursed lip breathing, and LE ex- reminded to not cross his legs. Improving steadily and very motivated. Should continue to benefit from PT to address goals for optimal functional outcomes.   Follow Up Recommendations  Home health PT     Equipment Recommendations       Recommendations for Other Services       Precautions / Restrictions Precautions Precautions: Fall Precaution Comments: Monitor SpO2 and HR, tends to run high on RR as he is abdominal breather. Restrictions Other Position/Activity Restrictions: Monitor closely as noted above related to O2 sats and HR..    Mobility  Bed Mobility Overal bed mobility: Modified Independent Bed Mobility: Supine to Sit     Supine to sit: Supervision;HOB elevated     General bed mobility comments: Use of bedrail. Supervision for line management and  safety  Transfers Overall transfer level: Needs assistance Equipment used: None Transfers: Sit to/from Stand Sit to Stand: Supervision         General transfer comment: Supervision to ensure safety and for line management. (Repositoned in bed for the exercises, but no ambulation today.)  Ambulation/Gait                 Stairs             Wheelchair Mobility    Modified Rankin (Stroke Patients Only)       Balance Overall balance assessment: Mild deficits observed, not formally tested                                          Cognition Arousal/Alertness: Awake/alert Behavior During Therapy: WFL for tasks assessed/performed Overall Cognitive Status: Within Functional Limits for tasks assessed                                 General Comments: Very pleasant, receptive to PT--stated he had been up in chair earlier today and really wanted to rest, but willing to do some exercises.      Exercises General Exercises - Lower Extremity Ankle Circles/Pumps: Supine;AROM(Reminded to not cross his legs, and to perform all ex as listed.) Quad Sets: AROM Hip ABduction/ADduction: AROM Straight Leg Raises: AROM Other Exercises Other Exercises: Reminded of pursed lip breathing with fairly  good return demo. Other Exercises: Reminded to perform ankle pumps frequently both in seated and while in bed.    General Comments General comments (skin integrity, edema, etc.): Monitor closely for RR and O2- intermittent high HR noted as well.      Pertinent Vitals/Pain Pain Assessment: No/denies pain    Home Living Family/patient expects to be discharged to:: Private residence                    Prior Function            PT Goals (current goals can now be found in the care plan section) Acute Rehab PT Goals Patient Stated Goal: to go home PT Goal Formulation: With patient Time For Goal Achievement: 06/30/19 Potential to Achieve  Goals: Good Progress towards PT goals: Progressing toward goals    Frequency    Min 3X/week      PT Plan      Co-evaluation              AM-PAC PT "6 Clicks" Mobility   Outcome Measure  Help needed turning from your back to your side while in a flat bed without using bedrails?: None Help needed moving from lying on your back to sitting on the side of a flat bed without using bedrails?: A Little Help needed moving to and from a bed to a chair (including a wheelchair)?: A Little Help needed standing up from a chair using your arms (e.g., wheelchair or bedside chair)?: A Little Help needed to walk in hospital room?: A Little Help needed climbing 3-5 steps with a railing? : A Little 6 Click Score: 19    End of Session   Activity Tolerance: Patient tolerated treatment well Patient left: in bed;with call bell/phone within reach Nurse Communication: Mobility status PT Visit Diagnosis: Muscle weakness (generalized) (M62.81);Unsteadiness on feet (R26.81)     Time: 0301-0329 PT Time Calculation (min) (ACUTE ONLY): 28 min  Charges:  $Therapeutic Exercise: 23-37 mins                    [name], PT # [phone] CGV cell  Ephriam Jenkins 06/19/2019, 5:22 PM

## 2019-06-20 LAB — COMPREHENSIVE METABOLIC PANEL
ALT: 59 U/L — ABNORMAL HIGH (ref 0–44)
AST: 34 U/L (ref 15–41)
Albumin: 2.8 g/dL — ABNORMAL LOW (ref 3.5–5.0)
Alkaline Phosphatase: 79 U/L (ref 38–126)
Anion gap: 10 (ref 5–15)
BUN: 23 mg/dL (ref 8–23)
CO2: 30 mmol/L (ref 22–32)
Calcium: 9 mg/dL (ref 8.9–10.3)
Chloride: 96 mmol/L — ABNORMAL LOW (ref 98–111)
Creatinine, Ser: 1.08 mg/dL (ref 0.61–1.24)
GFR calc Af Amer: >60 mL/min (ref >60)
GFR calc non Af Amer: >60 mL/min (ref >60)
Glucose, Bld: 169 mg/dL — ABNORMAL HIGH (ref 70–99)
Potassium: 4.8 mmol/L (ref 3.5–5.1)
Sodium: 136 mmol/L (ref 135–145)
Total Bilirubin: 0.8 mg/dL (ref 0.3–1.2)
Total Protein: 6.5 g/dL (ref 6.5–8.1)

## 2019-06-20 LAB — GLUCOSE, CAPILLARY
Glucose-Capillary: 412 mg/dL — ABNORMAL HIGH (ref 70–99)
Glucose-Capillary: 306 mg/dL — ABNORMAL HIGH (ref 70–99)

## 2019-06-20 LAB — C-REACTIVE PROTEIN: CRP: 1.1 mg/dL — ABNORMAL HIGH (ref <1.0)

## 2019-06-20 LAB — D-DIMER, QUANTITATIVE: D-Dimer, Quant: >20 ug/mL-FEU — ABNORMAL HIGH (ref 0.00–0.50)

## 2019-06-20 LAB — FERRITIN: Ferritin: 1211 ng/mL — ABNORMAL HIGH (ref 24–336)

## 2019-06-20 NOTE — Progress Notes (Signed)
Placed on 3 LNC while eating.

## 2019-06-20 NOTE — Progress Notes (Signed)
PROGRESS NOTE    Joseph Todd  ZOX:[id number] DOB: 1956-03-14 DOA: 06/14/2019 PCP: Patient, No Pcp Per    Brief Narrative:  Patient was admitted to the hospital with a working diagnosis of acute hypoxic respiratory failure due to SARS COVID-19 viral pneumonia  64 year old male who presented with dyspnea.  He does have significant past medical history for type 2 diabetes mellitus.  He reported 1 week of dyspnea, generalized weakness and nausea, associated with subjective fevers.  On his initial physical examination he was in respiratory distress, respiratory rate 34, heart rate 101, oxygen saturation 86%, blood pressure 116/80.  His lungs are clear to auscultation, no wheezing or rhonchi, heart S1-S2 present and rhythmic, soft abdomen, no lower extremity edema.  SARS COVID-19 positive. Chest radiograph with bilateral interstitial infiltrates at bases.  CT chest negative for pulmonary embolism, bilateral subpleural groundglass opacities, positive subpleural blebs.  EKG 110 bpm, normal axis, normal intervals, sinus rhythm, no ST segment or T wave changes.  Patient has received supplemental oxygen per high flow nasal cannula, systemic or steroids, COVID-19 convalescent plasma and Tocilizumab x2.   Oxygen requirements have been improving.   Assessment & Plan:   Principal Problem:   Pneumonia due to COVID-19 virus Active Problems:   DM (diabetes mellitus) (HCC)   Acute respiratory failure due to COVID-19 (HCC)   1. Acute hypoxic respiratory failure due to SARS COVID 19 viral pneumonia. Sp #5/5 remdesivir, tocilizumab x2 (02/06 and 02/07), covid 19 convalescent plasma 02/07.   RR: 20  Pulse oxymetry: 86 to 90 Fi02: 1 to 2 L/ min per [place]off[place]  COVID-19 Labs  Recent Labs    06/18/19 0405 06/19/19 0330 06/20/19 0316  DDIMER 5.51* >20.00* >20.00*  FERRITIN 1,618* 1,397* 1,211*  CRP 3.9* 1.9* 1.1*    Lab Results  Component Value Date   SARSCOV2NAA POSITIVE (A) 06/14/2019   SARSCOV2NAA  NEGATIVE 06/09/2019    Symptoms and oxygen requirements are stable. D dimer continue to be more than 20. He has been ruled out for PE and DVT.   Continue medical therapy with systemic corticosteroids dexamethasone 6 mg IV q 24, on antitussive agents, bronchodilators and airway clearing techniques with flutter valve and incentive spirometer.   Urine output over last 24 H, 3,650 ml, will hold on further diuresis for now.   On high dose of prophylactic enoxaparin. Continue to encourage out of bed to chair tid with meals, physical and occupational therapy.   2. Uncontrolled T2DM (Hgb A1c 11,9) with steroid induced hyperglycemia. FAsting glucose 169. On basal insulin and insulin sliding scale for glucose cover and monitoring. Continue with linagliptin.   3. HTN. Continue blood pressure control with metoprolol.    DVT prophylaxis: enoxaparin   Code Status:  full Family Communication: I spoke over the phone with the patient's wife  about patient's  condition, plan of care, prognosis and all questions were addressed. Disposition Plan/ discharge barriers: possible dc in am if oxygen saturation more than 88 on less than 5 L/ min per [place].    Subjective: Patient slowly improving dyspnea, but not yet back to baseline, no further chest pain, no nausea or vomiting. Continue to use supplemental 02 per [place].   Objective: Vitals:   06/19/19 1949 06/20/19 0324 06/20/19 0733 06/20/19 0831  BP: 104/69 118/82 107/68   Pulse: (!) 118 (!) 102 97   Resp: 17 18 20    Temp: 98.5 F (36.9 C) 98.2 F (36.8 C) 98 F (36.7 C)   TempSrc: Oral Oral Oral  SpO2: 93%  90% (!) 86%  Weight:      Height:        Intake/Output Summary (Last 24 hours) at 06/20/2019 0837 Last data filed at 06/20/2019 0737 Gross per 24 hour  Intake 720 ml  Output 3650 ml  Net -2930 ml   Filed Weights   06/14/19 1801 06/15/19 0124  Weight: 80.7 kg 79.9 kg    Examination:   General: deconditioned, no significant dyspnea at  rest.  Neurology: Awake and alert, non focal  E ENT: mild pallor, no icterus, oral mucosa moist Cardiovascular: No JVD. S1-S2 present, rhythmic, no gallops, rubs, or murmurs. No lower extremity edema. Pulmonary: positive breath sounds bilaterally. Gastrointestinal. Abdomen with, no organomegaly, non tender, no rebound or guarding Skin. No rashes Musculoskeletal: no joint deformities     Data Reviewed: I have personally reviewed following labs and imaging studies  CBC: Recent Labs  Lab 06/15/19 0450 06/16/19 0319 06/17/19 0306 06/18/19 0405 06/19/19 0330  WBC 8.7 8.3 9.4 8.1 7.4  NEUTROABS 7.0 6.2 7.1 4.9 4.4  HGB 14.8 14.6 14.1 15.1 15.2  HCT 46.9 45.7 44.2 47.3 47.6  MCV 90.9 90.3 90.6 89.9 89.6  PLT 339 368 383 363 273   Basic Metabolic Panel: Recent Labs  Lab 06/16/19 0319 06/17/19 0306 06/18/19 0405 06/19/19 0330 06/20/19 0316  NA 138 137 138 136 136  K 5.0 5.0 4.6 4.5 4.8  CL 102 101 102 100 96*  CO2 27 26 27 28 30   GLUCOSE 225* 198* 85 122* 169*  BUN 22 22 22 18 23   CREATININE 1.12 0.99 1.14 1.00 1.08  CALCIUM 8.5* 8.8* 8.7* 8.5* 9.0   GFR: Estimated Creatinine Clearance: 69.5 mL/min (by C-G formula based on SCr of 1.08 mg/dL). Liver Function Tests: Recent Labs  Lab 06/16/19 0319 06/17/19 0306 06/18/19 0405 06/19/19 0330 06/20/19 0316  AST 44* 53* 61* 49* 34  ALT 48* 57* 69* 61* 59*  ALKPHOS 67 78 82 81 79  BILITOT 0.9 0.6 0.6 0.9 0.8  PROT 6.9 7.2 6.8 6.3* 6.5  ALBUMIN 2.6* 2.8* 2.6* 2.6* 2.8*   No results for input(s): LIPASE, AMYLASE in the last 168 hours. No results for input(s): AMMONIA in the last 168 hours. Coagulation Profile: No results for input(s): INR, PROTIME in the last 168 hours. Cardiac Enzymes: No results for input(s): CKTOTAL, CKMB, CKMBINDEX, TROPONINI in the last 168 hours. BNP (last 3 results) No results for input(s): PROBNP in the last 8760 hours. HbA1C: No results for input(s): HGBA1C in the last 72  hours. CBG: Recent Labs  Lab 06/15/19 0036 06/15/19 0800 06/15/19 1118 06/15/19 1645 06/18/19 1149  GLUCAP 216* 194* 282* 288* 122*   Lipid Profile: No results for input(s): CHOL, HDL, LDLCALC, TRIG, CHOLHDL, LDLDIRECT in the last 72 hours. Thyroid Function Tests: No results for input(s): TSH, T4TOTAL, FREET4, T3FREE, THYROIDAB in the last 72 hours. Anemia Panel: Recent Labs    06/19/19 0330 06/20/19 0316  FERRITIN 1,397* 1,211*      Radiology Studies: I have reviewed all of the imaging during this hospital visit personally     Scheduled Meds: . vitamin C  500 mg Oral Daily  . chlorpheniramine-HYDROcodone  5 mL Oral Q12H  . dexamethasone (DECADRON) injection  6 mg Intravenous Daily  . enoxaparin (LOVENOX) injection  40 mg Subcutaneous BID  . fluticasone  2 spray Each Nare BID  . insulin aspart  0-5 Units Subcutaneous QHS  . insulin aspart  0-9 Units Subcutaneous TID WC  .  insulin detemir  10 Units Subcutaneous QHS  . Ipratropium-Albuterol  1 puff Inhalation Q6H  . linagliptin  5 mg Oral Daily  . metoprolol tartrate  25 mg Oral BID  . senna-docusate  2 tablet Oral BID  . sodium chloride flush  3 mL Intravenous Q12H  . zinc sulfate  220 mg Oral Daily   Continuous Infusions: . sodium chloride       LOS: 6 days        Suzanna Zahn Gerome Apley, MD

## 2019-06-20 NOTE — Progress Notes (Signed)
Physical Therapy Treatment Patient Details Name: Andric Kerce MRN: [id number] DOB: 10/03/1955 Today's Date: 06/20/2019    History of Present Illness Chazz Philson is a 64 year old African-American male with past medical history remarkable for type 2 diabetes mellitus who presented to the ED with 1 week history of weakness, shortness of breath and nausea.  Patient reports dyspnea has been progressive with subjective fevers.  Denies vomiting/diarrhea, no chest pain, no abdominal pain. Recently hospitalized a week ago with syncope, Covid-19 PCR was negative at that time. In the ED, patient was noted to be hypoxic requiring nonrebreather to maintain adequate oxygenation.  ED PA referred for admission for Covid pneumonia with hypoxia.    PT Comments    Patient was in the bathroom when PT arrived. He had taken himself off his oxygen and the O2 sensor (disconnected from telemetry)- and when he came out of the bathroom and walked to the BS chair, he had desatted to 73%- with reapplication of O2 at 3 LPM, [place], he was able to recover < 5 minutes to > 94%. Reviewed pursed lip breathing, reminded to continue the breathing ex as well as the LE ex. He remains very pleasant and motivated. He states he did not want to "get too excited about going home just yet due to the issues with his heart". Should continue to benefit from ongoing PT while in hospital.   Follow Up Recommendations  Home health PT     Equipment Recommendations       Recommendations for Other Services       Precautions / Restrictions Precautions Precaution Comments: Monitor SpO2 Restrictions Weight Bearing Restrictions: No Other Position/Activity Restrictions: Monitor closely as noted above related to O2 sats and HR..    Mobility  Bed Mobility Overal bed mobility: Modified Independent Bed Mobility: Supine to Sit     Supine to sit: Supervision;HOB elevated     General bed mobility comments: Patient in bathroom when PT  arrived  Transfers Overall transfer level: Needs assistance Equipment used: None Transfers: Sit to/from Stand Sit to Stand: Supervision         General transfer comment: Supervision to ensure safety with O2 tubing  Ambulation/Gait Ambulation/Gait assistance: Min guard   Assistive device: None Gait Pattern/deviations: Narrow base of support;Shuffle   Gait velocity interpretation: >2.62 ft/sec, indicative of community ambulatory     Stairs             Wheelchair Mobility    Modified Rankin (Stroke Patients Only)       Balance Overall balance assessment: Mild deficits observed, not formally tested                                          Cognition Arousal/Alertness: Awake/alert Behavior During Therapy: WFL for tasks assessed/performed Overall Cognitive Status: Within Functional Limits for tasks assessed                                 General Comments: Very pleasant- states he had been practicing his IS and pursed lip breathing. He also indicated that he did not want to "get too exited about going home due to issues with his heart"      Exercises Other Exercises Other Exercises: Encouraged pursed lip breathing throughout Other Exercises: Reminded to perform ankle pumps frequently both in seated and while in bed.  Other Exercises: Practiced IS and FLutter Valve unit with good return demo    General Comments General comments (skin integrity, edema, etc.): Reapplied oxygen when he completed toileting and by the time he had come out of the bathroom and walked to the chair, he had desatted to 72%--but immediately recovered with the O2- he had taken himself off his O2 sensor and the oxygen to walk to the bathroom      Pertinent Vitals/Pain Pain Assessment: No/denies pain    Home Living                      Prior Function            PT Goals (current goals can now be found in the care plan section) Acute Rehab PT  Goals Patient Stated Goal: to go home PT Goal Formulation: With patient Time For Goal Achievement: 06/30/19 Potential to Achieve Goals: Good Progress towards PT goals: Progressing toward goals    Frequency    Min 3X/week      PT Plan      Co-evaluation              AM-PAC PT "6 Clicks" Mobility   Outcome Measure  Help needed turning from your back to your side while in a flat bed without using bedrails?: None Help needed moving from lying on your back to sitting on the side of a flat bed without using bedrails?: A Little Help needed moving to and from a bed to a chair (including a wheelchair)?: A Little Help needed standing up from a chair using your arms (e.g., wheelchair or bedside chair)?: A Little Help needed to walk in hospital room?: A Little Help needed climbing 3-5 steps with a railing? : A Little 6 Click Score: 19    End of Session   Activity Tolerance: Patient tolerated treatment well Patient left: with call bell/phone within reach;in chair Nurse Communication: Mobility status PT Visit Diagnosis: Muscle weakness (generalized) (M62.81);Unsteadiness on feet (R26.81)     Time: 0321-0350 PT Time Calculation (min) (ACUTE ONLY): 29 min  Charges:  $Gait Training: 8-22 mins $Therapeutic Activity: 8-22 mins                    Rollen Sox, PT # [phone] CGV cell   Casandra Doffing 06/20/2019, 5:25 PM

## 2019-06-20 NOTE — Progress Notes (Signed)
Occupational Therapy Treatment Patient Details Name: Joseph Todd MRN: [id number] DOB: Apr 30, 1956 Today's Date: 06/20/2019    History of present illness Joseph Todd is a 64 year old African-American male with past medical history remarkable for type 2 diabetes mellitus who presented to the ED with 1 week history of weakness, shortness of breath and nausea.  Patient reports dyspnea has been progressive with subjective fevers.  Denies vomiting/diarrhea, no chest pain, no abdominal pain. Recently hospitalized a week ago with syncope, Covid-19 PCR was negative at that time. In the ED, patient was noted to be hypoxic requiring nonrebreather to maintain adequate oxygenation.  ED PA referred for admission for Covid pneumonia with hypoxia.   OT comments  Pt making good progress in therapy, demonstrating improved activity tolerance and independence with self-care and mobility tasks. Pt ambulated to/from bathroom with supervision, noting 0 instances of loss of balance. Pt completed toileting task independently. Pt tolerated standing at the sink 1 x 20 min to complete grooming and sponge bathing tasks. Educated/instructed pt on shower transfer technique with pt able to complete with min guard. Noted 0 instances of loss of balance. Continued education/instruction with pt on safety strategies with oxygen tubing to prevent future falls with good understanding. Continued education with pt on energy conservation techniques with handout provided. Pt on 3L [place] with SpO2 maintaining in 90s throughout. Pt reported min shortness of breath with activity. OT will continue to follow acutely.    Follow Up Recommendations  No OT follow up;Supervision - Intermittent    Equipment Recommendations  3 in 1 bedside commode(for use in shower)    Recommendations for Other Services      Precautions / Restrictions Precautions Precautions: Other (comment) Precaution Comments: Monitor SpO2 Restrictions Weight Bearing  Restrictions: No       Mobility Bed Mobility               General bed mobility comments: Pt seated in bedside chair upon OT arrival.  Transfers Overall transfer level: Needs assistance Equipment used: None Transfers: Sit to/from Stand Sit to Stand: Supervision         General transfer comment: Supervision to ensure safety with O2 tubing    Balance Overall balance assessment: Mild deficits observed, not formally tested                                         ADL either performed or assessed with clinical judgement   ADL Overall ADL's : Needs assistance/impaired     Grooming: Set up;Wash/dry hands;Wash/dry face;Oral care;Standing(Distant supervision)   Upper Body Bathing: Set up(Distant supervision)   Lower Body Bathing: Set up(Standing. Distant supervision)           Toilet Transfer: Independent;Ambulation;Regular Toilet   Toileting- Architect and Hygiene: Independent;Sit to/from stand       Functional mobility during ADLs: Supervision/safety General ADL Comments: Pt able to ambulate to/from bathroom without an assistive device. Noted 0 instances of LOB.      Vision       Perception     Praxis      Cognition Arousal/Alertness: Awake/alert Behavior During Therapy: WFL for tasks assessed/performed Overall Cognitive Status: Within Functional Limits for tasks assessed  Exercises Exercises: Other exercises Other Exercises Other Exercises: Encouraged pursed lip breathing throughout   Shoulder Instructions       General Comments Pt on 3L [place] with SpO2 maintaining in 90s througout. Pt reported min SOB during activity.    Pertinent Vitals/ Pain       Pain Assessment: No/denies pain  Home Living                                          Prior Functioning/Environment              Frequency           Progress Toward Goals  OT  Goals(current goals can now be found in the care plan section)  Progress towards OT goals: Progressing toward goals  ADL Goals Pt Will Perform Grooming: Independently;standing Pt Will Perform Lower Body Bathing: Independently;sit to/from stand Pt Will Perform Lower Body Dressing: Independently;sit to/from stand Pt Will Transfer to Toilet: Independently;ambulating;regular height toilet Pt Will Perform Toileting - Clothing Manipulation and hygiene: Independently;sit to/from stand Additional ADL Goal #1: Pt to tolerate standing up to 10 min independently with SpO2 maintaining above 90%, in preparation for ADLs. Additional ADL Goal #2: Pt to recall and verbalize 3 energy conservation techniques with 0 verbal cues.  Plan Discharge plan remains appropriate    Co-evaluation                 AM-PAC OT "6 Clicks" Daily Activity     Outcome Measure   Help from another person eating meals?: None Help from another person taking care of personal grooming?: A Little Help from another person toileting, which includes using toliet, bedpan, or urinal?: None Help from another person bathing (including washing, rinsing, drying)?: A Little Help from another person to put on and taking off regular upper body clothing?: A Little Help from another person to put on and taking off regular lower body clothing?: A Little 6 Click Score: 20    End of Session Equipment Utilized During Treatment: Oxygen  OT Visit Diagnosis: Unsteadiness on feet (R26.81);Muscle weakness (generalized) (M62.81)   Activity Tolerance Patient tolerated treatment well   Patient Left in chair;with call bell/phone within reach   Nurse Communication Mobility status        Time: 1025-8527 OT Time Calculation (min): 41 min  Charges: OT General Charges $OT Visit: 1 Visit OT Treatments $Self Care/Home Management : 8-22 mins $Therapeutic Activity: 23-37 mins  Mauri Brooklyn OTR/L [phone]   Mauri Brooklyn 06/20/2019,  11:47 AM

## 2019-06-20 NOTE — Progress Notes (Signed)
Patient deferred family update phone call. 

## 2019-06-20 NOTE — Progress Notes (Signed)
Inpatient Diabetes Program Recommendations  AACE/ADA: New Consensus Statement on Inpatient Glycemic Control (2015)  Target Ranges:  Prepandial:   less than 140 mg/dL      Peak postprandial:   less than 180 mg/dL (1-2 hours)      Critically ill patients:  140 - 180 mg/dL   Lab Results  Component Value Date   GLUCAP 122 (H) 06/18/2019   HGBA1C 11.9 (H) 06/09/2019    Diabetes history:DM 2 Outpatient Diabetes medications:metformin 500 mg Daily Current orders for Inpatient glycemic control: Levemir10 units qhs Novolog 0-9 units tid + hs Tradjenta 5 mg qd  Decadron 6 mg QD  Inpatient Diabetes Program Recommendations:    In the setting of steroids, consider adding Novolog 3 units TID (assuming patient is consuming >50% of meal).   Thanks, Lujean Rave, MSN, RNC-OB Diabetes Coordinator [phone] (8a-5p)

## 2019-06-21 LAB — COMPREHENSIVE METABOLIC PANEL
ALT: 72 U/L — ABNORMAL HIGH (ref 0–44)
AST: 47 U/L — ABNORMAL HIGH (ref 15–41)
Albumin: 3 g/dL — ABNORMAL LOW (ref 3.5–5.0)
Alkaline Phosphatase: 95 U/L (ref 38–126)
Anion gap: 9 (ref 5–15)
BUN: 29 mg/dL — ABNORMAL HIGH (ref 8–23)
CO2: 29 mmol/L (ref 22–32)
Calcium: 9.3 mg/dL (ref 8.9–10.3)
Chloride: 96 mmol/L — ABNORMAL LOW (ref 98–111)
Creatinine, Ser: 1.17 mg/dL (ref 0.61–1.24)
GFR calc Af Amer: >60 mL/min (ref >60)
GFR calc non Af Amer: >60 mL/min (ref >60)
Glucose, Bld: 175 mg/dL — ABNORMAL HIGH (ref 70–99)
Potassium: 5.1 mmol/L (ref 3.5–5.1)
Sodium: 134 mmol/L — ABNORMAL LOW (ref 135–145)
Total Bilirubin: 0.8 mg/dL (ref 0.3–1.2)
Total Protein: 6.9 g/dL (ref 6.5–8.1)

## 2019-06-21 LAB — FERRITIN: Ferritin: 1389 ng/mL — ABNORMAL HIGH (ref 24–336)

## 2019-06-21 LAB — D-DIMER, QUANTITATIVE: D-Dimer, Quant: 19.65 ug/mL-FEU — ABNORMAL HIGH (ref 0.00–0.50)

## 2019-06-21 LAB — C-REACTIVE PROTEIN: CRP: 1.4 mg/dL — ABNORMAL HIGH (ref <1.0)

## 2019-06-21 MED ORDER — IPRATROPIUM-ALBUTEROL 20-100 MCG/ACT IN AERS: 1.0000 | INHALATION_SPRAY | Freq: Four times a day (QID) | RESPIRATORY_TRACT | 0 refills | Status: AC | PRN

## 2019-06-21 MED ORDER — GUAIFENESIN-DM 100-10 MG/5ML PO SYRP: 5.0000 mL | ORAL_SOLUTION | Freq: Four times a day (QID) | ORAL | 0 refills | Status: DC | PRN

## 2019-06-21 MED ORDER — ENOXAPARIN SODIUM 40 MG/0.4ML ~~LOC~~ SOLN: 40.0000 mg | SUBCUTANEOUS | 0 refills | Status: DC

## 2019-06-21 NOTE — Plan of Care (Signed)
  Problem: Education: Goal: Knowledge of risk factors and measures for prevention of condition will improve Outcome: Progressing   Problem: Coping: Goal: Psychosocial and spiritual needs will be supported Outcome: Progressing   Problem: Respiratory: Goal: Will maintain a patent airway Outcome: Progressing Goal: Complications related to the disease process, condition or treatment will be avoided or minimized Outcome: Progressing   

## 2019-06-21 NOTE — Progress Notes (Signed)
Completed patient teaching for administration of enoxaparin injection. Patient able to follow instructions and self administer himself with injectable. Stated that he felt comfortable enough to be able to complete task when discharged home. Janean Sark, RN

## 2019-06-21 NOTE — Progress Notes (Signed)
Completed discharge teaching as well as teaching on how to self administer the enoxaparin. Verbalizes understanding and also knows to call if he has questions or concerns.  Waiting for O2 delivery to home and then the patient will be DC'ed via EMS

## 2019-06-21 NOTE — Progress Notes (Addendum)
Inpatient Diabetes Program Recommendations  AACE/ADA: New Consensus Statement on Inpatient Glycemic Control (2015)  Target Ranges:  Prepandial:   less than 140 mg/dL      Peak postprandial:   less than 180 mg/dL (1-2 hours)      Critically ill patients:  140 - 180 mg/dL   Lab Results  Component Value Date   GLUCAP 306 (H) 06/20/2019   HGBA1C 11.9 (H) 06/09/2019    Review of Glycemic Control Results for Joseph Todd, Joseph Todd (MRN [id number]) as of 06/21/2019 08:58  Ref. Range 06/20/2019 16:26 06/20/2019 19:35  Glucose-Capillary Latest Ref Range: 70 - 99 mg/dL 349 (H) 494 (H)   Diabetes history:DM 2 Outpatient Diabetes medications:metformin 500 mg Daily Current orders for Inpatient glycemic control: Levemir10units qhs Novolog 0-9 units tid + hs Tradjenta 5 mg qd  Decadron 6 mg QD  Inpatient Diabetes Program Recommendations:    In the setting of steroids, consider adding  Novolog 3 units TID (assuming patient is consuming >50% of meal) and Levemir to BID dosing.  Addendum: Patient to discharge today. Discussion with Dr Ella Jubilee on A1C, disposition, insurance preferred insulins, taught insulin injections and recommendation for insulin at discharge even with steroids being discontinued. Plan for patient to follow up with PCP.    Thanks, Lujean Rave, MSN, RNC-OB Diabetes Coordinator [phone] (8a-5p)

## 2019-06-21 NOTE — TOC Transition Note (Signed)
Transition of Care [facility]) - CM/SW Discharge Note   Patient Details  Name: Joseph Todd MRN: [id number] Date of Birth: Jan 15, 1956  Transition of Care [facility]) CM/SW Contact:  Ross Ludwig, LCSW Phone Number: 06/21/2019, 3:33 PM   Clinical Narrative:     Patient is a 64 year old male who is married and lives with his wife and has not had home health or oxygen before.  CSW spoke to patient's wife and informed her that CSW is working on trying to get equipment and home health set up for patient.  1:30pm CSW spoke to [place] at [facility] and she told me to send clinical information to the [place] to get approval for oxygen, home health, and DME.  CSW faxed requested information at 1:45pm.  CSW informed her that patient will need oxygen and he is ready for discharge today, she said that if the respiratory department receives orders by 3pm they can get home oxygen delivered same day.  Patient has [employer] benefits, and CSW has faxed clinical information along with orders for oxygen to the [facility] respiratory department for oxygen at [facility],  [phone] ext [phone].    Orders and clinicals for a 3 in 1, and home health PT, OT, and RN through [facility] were sent to patient's PCP Dr. Anson [place] fax [phone], phone is [phone].  2:00pm CSW tried to contact [place] respiratory department left a message for call back.  2:30pm  CSW attempted to contact [place] respiratory department again, left another message.  3:15pm  CSW attempted to call [place] respiratory department again, left another message waiting for a call back.  4:30pm  CSW attempted to call [facility] [phone] ext [phone] left another message waiting for call back.  4:40pm  CSW called [employer], [employer], and [employer] to see if any of the agencies have a contract with [facility], CSW was informed that [facility] is the agency that has the [employer], [phone], [place] spoke to them, they said they have not received orders from the  PCP at the [place] who has to put the consult to respiratory at [facility].  CSW called [place] spoke to a ED nurse, and she was reviewing the patient's information, and she saw the orders from the hospital were received before 3pm and the PCP signed orders for the respiratory consult, however CSW has not been able to speak to someone from respiratory department to confirm they have received the consult.  Per [facility] ER nurse, she does not see a consult for the respiratory department to order the oxygen to be delivered to patient.  CSW notified TOC supervisor who will see if weekend [facility] team can work with the [place] to get patient the oxygen he needs for a safe discharge home.  5:40pm  CSW updated physician, patient's wife, the patient, and bedside nurse that CSW has not received confirmation that oxygen can be delivered.  CSW to continue to follow patient's progress throughout discharge planning.  Final next level of care: [place] Barriers to Discharge: Barriers Resolved   Patient Goals and CMS Choice Patient states their goals for this hospitalization and ongoing recovery are:: To return back home with home health PT, OT, and RN. CMS Medicare.gov Compare Post Acute Care list provided to:: Patient Represenative (must comment) Choice offered to / list presented to : Spouse  Discharge Placement  Home with home health and oxygen.  Patient to be transferred to facility by: [facility] Name of family member notified: Tyler Aas patient's wife, [phone] Patient and family notified of of transfer: 06/21/19  Discharge Plan and Services  Home with oxygen and home health services through [facility]              DME Arranged: Oxygen, 3-N-1 DME Agency: [facility], [place] Date DME Agency Contacted: 06/21/19 Time DME Agency Contacted: 1345 Representative spoke with at DME Agency: Left message HH Arranged: PT, OT, RN HH Agency: [facility] Date [facility]  Agency Contacted: 06/21/19 Time HH Agency Contacted: 1200 Representative spoke with at [facility] Agency: Amy  Social Determinants of Health (SDOH) Interventions     Readmission Risk Interventions No flowsheet data found.

## 2019-06-21 NOTE — Progress Notes (Signed)
SATURATION QUALIFICATIONS: (This note is used to comply with regulatory documentation for home oxygen)  Patient Saturations on Room Air at Rest = 90%  Patient Saturations on Room Air while Ambulating = 85%  Patient Saturations on 2 Liters of oxygen while Ambulating = 93%  Please briefly explain why patient needs home oxygen:  Needs 2L O2 to help complete ADL's.

## 2019-06-21 NOTE — Discharge Summary (Addendum)
Physician Discharge Summary  Kaimen Peine MHD:[id number] DOB: 1955/12/16 DOA: 06/14/2019  PCP: Patient, No Pcp Per  Admit date: 06/14/2019 Discharge date: 06/25/2019  Admitted From: home  Disposition:   Home   Recommendations for Outpatient Follow-up and new medication changes:  1. Follow up with primary care in 2 weeks.  2. Continue quarantine for 2 weeks, use mask in public and maintain physical distancing. 3. Patient's mobility has improved, d dimer trending down, enoxaparin as outpatient was cancelled. 4. Metformin was increased to 500 mg po bid.   Home Health: Yes   Equipment/Devices: 3:1    Discharge Condition: stable  CODE STATUS: full  Diet recommendation: Diabetic prudent.   Brief/Interim Summary: Patient was admitted to the hospital with a working diagnosis of acute hypoxic respiratory failure due to SARS COVID-19 viral pneumonia  64 year old male who presented with dyspnea. He does have significant past medical history for type 2 diabetes mellitus. He reported 1 week of dyspnea, generalized weakness and nausea,associated with subjective fevers. On his initial physical examination he was in respiratory distress, respiratory rate 34, heart rate 101, oxygen saturation 86%, blood pressure 116/80.His lungs were clear to auscultation, no wheezing or rhonchi, heart S1-S2 present and rhythmic, soft abdomen, no lower extremity edema.SARS COVID-19 positive. Chest radiograph with bilateral interstitial infiltrates at bases. Sodium 132, potassium 4.2, chloride 96, bicarb 25, glucose 212, BUN 15, creatinine 1.0.  White count 8.3, hemoglobin 15.0, hematocrit 44.9, platelets 274.  CT chest negative for pulmonary embolism, bilateral subpleural groundglass opacities,positive subpleural blebs.EKG 110 bpm, normal axis, normal intervals, sinus rhythm, no ST segment or T wave changes.  Patient has received supplemental oxygen per high flow nasal cannula, systemic or steroids, COVID-19  convalescent plasma and Tocilizumabx2.  Oxygen requirements have been improving. Lower extremity US negative for DVT.   1.  Acute hypoxic respiratory failure due to SARS COVID-19 viral pneumonia.  Patient was admitted to the medical ward, he received supplemental oxygen per nasal cannula, medical therapy with remdesivr and dexamethasone.  Due to severe hypoxic respiratory failure he was treated with 2 doses of Tocilizumab and 1 transfusion of COVID-19 convalescent plasma.  Further work-up with ultrasonography of the lower extremities was negative for deep vein thrombosis.  Patient also received antitussive agents, bronchodilators and airway clearance techniques with incentive spirometer and flutter valve.  Developed noncardiogenic pulmonary edema that required diuresis with furosemide.  Patient symptoms and inflammatory markers improved.  His oxygen saturation is 95% on 2 L per nasal cannula.  D dimer trending down and improved mobility, will hold on further outpatient dvt prophylaxis.    Physical/occupational therapy recommended home health services.  2.  Uncontrolled type 2 diabetes mellitus, hemoglobin A1c 11.9 complicated by steroid-induced hyperglycemia.  Patient received insulin therapy, basal along with sliding scale, while hospitalized received linagliptin.  At discharge patient will resume Metformin, follow-up with primary care provider.  3.  Hypertension.  His blood pressure remained well controlled with metoprolol. At home not taking antihypertensive medications.   4. Hyperkalemia. Patient developed hyperkalemia, that required medical therapy with IV insulin/ Dextrose, along with sodium zirconium.   Discharge Diagnoses:  Principal Problem:   Pneumonia due to COVID-19 virus Active Problems:   DM (diabetes mellitus) (HCC)   Acute respiratory failure due to COVID-19 [facility])    Discharge Instructions   Allergies as of 06/25/2019   No Known Allergies     Medication List     TAKE these medications   cyanocobalamin 1000 MCG/ML injection Commonly known as: (VITAMIN B-12)  Inject 1,000 mcg into the muscle every 30 (thirty) days.   guaiFENesin-dextromethorphan 100-10 MG/5ML syrup Commonly known as: ROBITUSSIN DM Take 5 mLs by mouth every 6 (six) hours as needed for cough (chest congestion).   Ipratropium-Albuterol 20-100 MCG/ACT Aers respimat Commonly known as: COMBIVENT Inhale 1 puff into the lungs every 6 (six) hours as needed for wheezing or shortness of breath.   metFORMIN 500 MG tablet Commonly known as: GLUCOPHAGE Take 1 tablet (500 mg total) by mouth 2 (two) times daily with a meal. What changed: when to take this   Senna 8.6 MG Caps Take 8.6 mg by mouth daily.   thiamine 100 MG tablet Take 100 mg by mouth 2 (two) times daily.   Vitamin D (Ergocalciferol) 1.25 MG (50000 UNIT) Caps capsule Commonly known as: DRISDOL Take 50,000 Units by mouth once a week.            Durable Medical Equipment  (From admission, onward)         Start     Ordered   06/21/19 1212  For home use only DME oxygen  Once    Question Answer Comment  Length of Need 6 Months   Mode or (Route) Nasal cannula   Liters per Minute 2   Frequency Continuous (stationary and portable oxygen unit needed)   Oxygen conserving device Yes   Oxygen delivery system Gas      06/21/19 1212   06/21/19 0838  For home use only DME 3 n 1  Once     06/21/19 0837          No Known Allergies  Consultations:     Procedures/Studies: CT Head Wo Contrast  Result Date: 06/09/2019 CLINICAL DATA:  Patient with recent syncopal episode. EXAM: CT HEAD WITHOUT CONTRAST CT CERVICAL SPINE WITHOUT CONTRAST TECHNIQUE: Multidetector CT imaging of the head and cervical spine was performed following the standard protocol without intravenous contrast. Multiplanar CT image reconstructions of the cervical spine were also generated. COMPARISON:  Brain CT 07/26/2006 FINDINGS: CT HEAD FINDINGS  Brain: Ventricles and sulci are appropriate for patient's age. No evidence for acute cortically based infarct, intracranial hemorrhage, mass lesion or mass-effect. Vascular: Unremarkable Skull: Intact. Sinuses/Orbits: Polypoid mucosal thickening involving the frontal sinus (image 13; series 4). Remainder the paranasal sinuses are well aerated. Mastoid air cells are unremarkable. Orbits are unremarkable. Other: None. CT CERVICAL SPINE FINDINGS Alignment: Reversal of the normal cervical lordosis. Skull base and vertebrae: Intact. Soft tissues and spinal canal: No prevertebral fluid or swelling. No visible canal hematoma. Disc levels: Degenerative disc disease most pronounced C5-6, C6-7 and C7-T1. No evidence for acute fracture. Upper chest: Unremarkable Other: None IMPRESSION: No acute intracranial process. Polypoid mucosal thickening involving the frontal sinus. No acute cervical spine fracture.  Degenerative disc disease. Electronically Signed   By: Annia Belt M.D.   On: 06/09/2019 05:50   CT ANGIO CHEST PE W OR WO CONTRAST  Result Date: 06/18/2019 CLINICAL DATA:  Shortness of breath, elevated D-dimer, tachycardia, COVID positive EXAM: CT ANGIOGRAPHY CHEST WITH CONTRAST TECHNIQUE: Multidetector CT imaging of the chest was performed using the standard protocol during bolus administration of intravenous contrast. Multiplanar CT image reconstructions and MIPs were obtained to evaluate the vascular anatomy. CONTRAST:  24mL OMNIPAQUE IOHEXOL 350 MG/ML SOLN COMPARISON:  06/14/2019 FINDINGS: Cardiovascular: Pulmonary arteries are well visualized. No significant filling defect or pulmonary embolus demonstrated by CTA. Minor aortic atherosclerosis without aneurysm, dissection, or other acute vascular process. Patent 3 vessel arch anatomy. No  mediastinal hemorrhage or hematoma. Normal heart size. Native coronary atherosclerosis noted. No pericardial effusion. Mediastinum/Nodes: Normal appearing thyroid. Trachea and  central airways are patent. Esophagus is nondilated. No large hiatal hernia. Mildly prominent scattered mediastinal and bi hilar lymph nodes, suspect reactive/inflammatory. No bulky adenopathy. Lungs/Pleura: Biapical subpleural blebs noted. Extensive bilateral peripheral mixed interstitial and airspace opacities throughout all lobes of both lungs becoming more pronounced in the lower lobes dependently compatible with pneumonia including atypical or viral pneumonia. No superimposed edema pattern. Minor associated scattered basilar atelectasis. Respiratory motion artifact through the lung bases. No other pleural abnormality, effusion or pneumothorax. Upper Abdomen: Lateral left hepatic dome hypodense cyst noted measuring 2.4 cm. Hypoattenuation of the liver compatible with mild hepatic steatosis. Gallbladder nondistended. No biliary obstruction or dilatation. No other acute upper abdominal finding. Musculoskeletal: No acute osseous finding. Review of the MIP images confirms the above findings. IMPRESSION: Negative for significant acute pulmonary embolus by CTA. Extensive bilateral peripheral mixed interstitial and airspace opacities throughout both lungs compatible with pneumonia including viral pneumonia. 2.4 cm left hepatic cyst.  Hepatic steatosis. Aortic Atherosclerosis (ICD10-I70.0). Electronically Signed   By: Judie Petit.  Shick M.D.   On: 06/18/2019 14:08   CT Cervical Spine Wo Contrast  Result Date: 06/09/2019 CLINICAL DATA:  Patient with recent syncopal episode. EXAM: CT HEAD WITHOUT CONTRAST CT CERVICAL SPINE WITHOUT CONTRAST TECHNIQUE: Multidetector CT imaging of the head and cervical spine was performed following the standard protocol without intravenous contrast. Multiplanar CT image reconstructions of the cervical spine were also generated. COMPARISON:  Brain CT 07/26/2006 FINDINGS: CT HEAD FINDINGS Brain: Ventricles and sulci are appropriate for patient's age. No evidence for acute cortically based infarct,  intracranial hemorrhage, mass lesion or mass-effect. Vascular: Unremarkable Skull: Intact. Sinuses/Orbits: Polypoid mucosal thickening involving the frontal sinus (image 13; series 4). Remainder the paranasal sinuses are well aerated. Mastoid air cells are unremarkable. Orbits are unremarkable. Other: None. CT CERVICAL SPINE FINDINGS Alignment: Reversal of the normal cervical lordosis. Skull base and vertebrae: Intact. Soft tissues and spinal canal: No prevertebral fluid or swelling. No visible canal hematoma. Disc levels: Degenerative disc disease most pronounced C5-6, C6-7 and C7-T1. No evidence for acute fracture. Upper chest: Unremarkable Other: None IMPRESSION: No acute intracranial process. Polypoid mucosal thickening involving the frontal sinus. No acute cervical spine fracture.  Degenerative disc disease. Electronically Signed   By: Annia Belt M.D.   On: 06/09/2019 05:50   DG Chest Portable 1 View  Result Date: 06/14/2019 CLINICAL DATA:  Dyspnea EXAM: PORTABLE CHEST 1 VIEW COMPARISON:  August 05, 2008 FINDINGS: The heart size and mediastinal contours are within normal limits. Hazy/patchy airspace opacity seen predominantly within both lower lungs at the periphery. The visualized skeletal structures are unremarkable. IMPRESSION: Multifocal patchy/ground-glass opacities seen predominantly within the lower lungs, likely consistent multifocal pneumonia. Electronically Signed   By: Jonna Clark M.D.   On: 06/14/2019 19:07   ECHOCARDIOGRAM COMPLETE  Result Date: 06/09/2019   ECHOCARDIOGRAM REPORT   Patient Name:   DAIRON PROCTER Date of Exam: 06/09/2019 Medical Rec #:  [id number]    Height:       68.0 in Accession #:    [id number]   Weight:       191.0 lb Date of Birth:  [birth year]    BSA:          2.00 m Patient Age:    63 years     BP:           96/81 mmHg Patient Gender: M  HR:           112 bpm. Exam Location:  Inpatient Procedure: 2D Echo, Color Doppler and Cardiac Doppler Indications:    R94.31  Abnormal EKG  History:        Patient has no prior history of Echocardiogram examinations.                 Signs/Symptoms:Syncope.  Sonographer:    Irving Burton Senior RDCS Referring Phys: [id number] Ernest Mallick IMPRESSIONS  1. Left ventricular ejection fraction, by visual estimation, is 60 to 65%. The left ventricle has normal function. There is no left ventricular hypertrophy.  2. Left ventricular diastolic parameters are consistent with Grade I diastolic dysfunction (impaired relaxation).  3. The left ventricle has no regional wall motion abnormalities.  4. Global right ventricle has normal systolic function.The right ventricular size is normal. No increase in right ventricular wall thickness.  5. Left atrial size was normal.  6. Right atrial size was normal.  7. The mitral valve is normal in structure. No evidence of mitral valve regurgitation. No evidence of mitral stenosis.  8. The tricuspid valve is normal in structure.  9. The tricuspid valve is normal in structure. Tricuspid valve regurgitation is trivial. 10. The aortic valve has an indeterminant number of cusps. Aortic valve regurgitation is not visualized. No evidence of aortic valve sclerosis or stenosis. 11. The pulmonic valve was not well visualized. Pulmonic valve regurgitation is not visualized. 12. Normal pulmonary artery systolic pressure. 13. The inferior vena cava is normal in size with greater than 50% respiratory variability, suggesting right atrial pressure of 3 mmHg. FINDINGS  Left Ventricle: Left ventricular ejection fraction, by visual estimation, is 60 to 65%. The left ventricle has normal function. The left ventricle has no regional wall motion abnormalities. There is no left ventricular hypertrophy. Left ventricular diastolic parameters are consistent with Grade I diastolic dysfunction (impaired relaxation). Normal left atrial pressure. Right Ventricle: The right ventricular size is normal. No increase in right ventricular wall thickness.  Global RV systolic function is has normal systolic function. The tricuspid regurgitant velocity is 1.72 m/s, and with an assumed right atrial pressure  of 3 mmHg, the estimated right ventricular systolic pressure is normal at 14.8 mmHg. Left Atrium: Left atrial size was normal in size. Right Atrium: Right atrial size was normal in size Pericardium: There is no evidence of pericardial effusion. Mitral Valve: The mitral valve is normal in structure. No evidence of mitral valve regurgitation. No evidence of mitral valve stenosis by observation. Tricuspid Valve: The tricuspid valve is normal in structure. Tricuspid valve regurgitation is trivial. Aortic Valve: The aortic valve has an indeterminant number of cusps. Aortic valve regurgitation is not visualized. The aortic valve is structurally normal, with no evidence of sclerosis or stenosis. Aortic valve mean gradient measures 2.7 mmHg. Aortic valve peak gradient measures 4.8 mmHg. Aortic valve area, by VTI measures 3.93 cm. Pulmonic Valve: The pulmonic valve was not well visualized. Pulmonic valve regurgitation is not visualized. Pulmonic regurgitation is not visualized. No evidence of pulmonic stenosis. Aorta: The aortic root is normal in size and structure. Pulmonary Artery: Indeterminate PASP, inadequate TR jet. Venous: The inferior vena cava is normal in size with greater than 50% respiratory variability, suggesting right atrial pressure of 3 mmHg. IAS/Shunts: No atrial level shunt detected by color flow Doppler.  LEFT VENTRICLE PLAX 2D LVIDd:         4.10 cm  Diastology LVIDs:         2.40  cm  LV e' lateral:   5.55 cm/s LV PW:         0.80 cm  LV E/e' lateral: 10.5 LV IVS:        0.80 cm  LV e' medial:    4.90 cm/s LVOT diam:     2.10 cm  LV E/e' medial:  11.9 LV SV:         54 ml LV SV Index:   26.24 LVOT Area:     3.46 cm  RIGHT VENTRICLE RV S prime:     13.30 cm/s TAPSE (M-mode): 1.7 cm LEFT ATRIUM             Index       RIGHT ATRIUM          Index LA diam:         3.50 cm 1.75 cm/m  RA Area:     7.18 cm LA Vol (A2C):   22.8 ml 11.38 ml/m RA Volume:   10.70 ml 5.34 ml/m LA Vol (A4C):   30.3 ml 15.12 ml/m LA Biplane Vol: 27.0 ml 13.47 ml/m  AORTIC VALVE AV Area (Vmax):    3.02 cm AV Area (Vmean):   3.12 cm AV Area (VTI):     3.93 cm AV Vmax:           109.95 cm/s AV Vmean:          78.934 cm/s AV VTI:            0.144 m AV Peak Grad:      4.8 mmHg AV Mean Grad:      2.7 mmHg LVOT Vmax:         95.80 cm/s LVOT Vmean:        71.100 cm/s LVOT VTI:          0.163 m LVOT/AV VTI ratio: 1.13  AORTA Ao Root diam: 3.50 cm Ao Asc diam:  3.00 cm MITRAL VALVE                        TRICUSPID VALVE MV Area (PHT): 3.77 cm             TR Peak grad:   11.8 mmHg MV PHT:        58.29 msec           TR Vmax:        172.00 cm/s MV Decel Time: 201 msec MV E velocity: 58.30 cm/s 103 cm/s  SHUNTS MV A velocity: 75.20 cm/s 70.3 cm/s Systemic VTI:  0.16 m MV E/A ratio:  0.78       1.5       Systemic Diam: 2.10 cm  Carlyle Dolly MD Electronically signed by Carlyle Dolly MD Signature Date/Time: 06/09/2019/11:54:53 AM    Final    VAS [place] LOWER EXTREMITY VENOUS (DVT)  Result Date: 06/18/2019  Lower Venous DVT Study Indications: Edema.  Risk Factors: COVID 19 positive. Limitations: Poor ultrasound/tissue interface. Comparison Study: No prior studies. Performing Technologist: Oliver Hum RVT  Examination Guidelines: A complete evaluation includes B-mode imaging, spectral Doppler, color Doppler, and power Doppler as needed of all accessible portions of each vessel. Bilateral testing is considered an integral part of a complete examination. Limited examinations for reoccurring indications may be performed as noted. The reflux portion of the exam is performed with the patient in reverse Trendelenburg.  +---------+---------------+---------+-----------+----------+--------------+ RIGHT    CompressibilityPhasicitySpontaneityPropertiesThrombus Aging  +---------+---------------+---------+-----------+----------+--------------+ CFV      Full  Yes      Yes                                 +---------+---------------+---------+-----------+----------+--------------+ SFJ      Full                                                        +---------+---------------+---------+-----------+----------+--------------+ FV Prox  Full                                                        +---------+---------------+---------+-----------+----------+--------------+ FV Mid   Full                                                        +---------+---------------+---------+-----------+----------+--------------+ FV DistalFull                                                        +---------+---------------+---------+-----------+----------+--------------+ PFV      Full                                                        +---------+---------------+---------+-----------+----------+--------------+ POP      Full           Yes      Yes                                 +---------+---------------+---------+-----------+----------+--------------+ PTV      Full                                                        +---------+---------------+---------+-----------+----------+--------------+ PERO     Full                                                        +---------+---------------+---------+-----------+----------+--------------+   +---------+---------------+---------+-----------+----------+--------------+ LEFT     CompressibilityPhasicitySpontaneityPropertiesThrombus Aging +---------+---------------+---------+-----------+----------+--------------+ CFV      Full           Yes      Yes                                 +---------+---------------+---------+-----------+----------+--------------+ SFJ  Full                                                         +---------+---------------+---------+-----------+----------+--------------+ FV Prox  Full                                                        +---------+---------------+---------+-----------+----------+--------------+ FV Mid   Full                                                        +---------+---------------+---------+-----------+----------+--------------+ FV DistalFull                                                        +---------+---------------+---------+-----------+----------+--------------+ PFV      Full                                                        +---------+---------------+---------+-----------+----------+--------------+ POP      Full           Yes      Yes                                 +---------+---------------+---------+-----------+----------+--------------+ PTV      Full                                                        +---------+---------------+---------+-----------+----------+--------------+ PERO     Full                                                        +---------+---------------+---------+-----------+----------+--------------+     Summary: RIGHT: - There is no evidence of deep vein thrombosis in the lower extremity.  - No cystic structure found in the popliteal fossa.  LEFT: - There is no evidence of deep vein thrombosis in the lower extremity.  - No cystic structure found in the popliteal fossa.  *See table(s) above for measurements and observations. Electronically signed by Waverly Ferrari MD on 06/18/2019 at 12:13:03 PM.    Final       Procedures:   Subjective: Patient is feeling better, dyspnea has been improving, no nausea or vomiting, no chest pain.   Discharge Exam: Vitals:   06/21/19 0412 06/21/19 0721  BP: 106/70 106/77  Pulse:  98  Resp: 20 20  Temp: 98.6 F (37 C) 98.2 F (36.8 C)  SpO2: 93% 95%   Vitals:   06/20/19 1716 06/20/19 1837 06/21/19 0412 06/21/19 0721  BP:   106/70 106/77   Pulse:  (!) 104  98  Resp:   20 20  Temp:   98.6 F (37 C) 98.2 F (36.8 C)  TempSrc:   Oral Oral  SpO2: 93%  93% 95%  Weight:      Height:        General: Not in pain or dyspnea.  Neurology: Awake and alert, non focal  E ENT: no pallor, no icterus, oral mucosa moist Cardiovascular: No JVD. S1-S2 present, rhythmic, no gallops, rubs, or murmurs. No lower extremity edema. Pulmonary: positive breath sounds bilaterally. Gastrointestinal. Abdomen with, no organomegaly, non tender, no rebound or guarding Skin. No rashes Musculoskeletal: no joint deformities   The results of significant diagnostics from this hospitalization (including imaging, microbiology, ancillary and laboratory) are listed below for reference.     Microbiology: Recent Results (from the past 240 hour(s))  Respiratory Panel by RT PCR (Flu A&B, Covid) - Nasopharyngeal Swab     Status: Abnormal   Collection Time: 06/14/19  6:25 PM   Specimen: Nasopharyngeal Swab  Result Value Ref Range Status   SARS Coronavirus 2 by RT PCR POSITIVE (A) NEGATIVE Final    Comment: RESULT CALLED TO, READ BACK BY AND VERIFIED WITH: B.BROOKS AT 2054 ON 06/14/19 BY N.THOMPSON (NOTE) SARS-CoV-2 target nucleic acids are DETECTED. SARS-CoV-2 RNA is generally detectable in upper respiratory specimens  during the acute phase of infection. Positive results are indicative of the presence of the identified virus, but do not rule out bacterial infection or co-infection with other pathogens not detected by the test. Clinical correlation with patient history and other diagnostic information is necessary to determine patient infection status. The expected result is Negative. Fact Sheet for Patients:  https://www.moore.com/https://www.fda.gov/media/142436/download Fact Sheet for Healthcare Providers: https://www.young.biz/https://www.fda.gov/media/142435/download This test is not yet approved or cleared by the [place] FDA and  has been authorized for detection and/or diagnosis of  SARS-CoV-2 by FDA under an Emergency Use Authorization (EUA).  This EUA will remain in effect (meaning this test can be us ed) for the duration of  the COVID-19 declaration under Section 564(b)(1) of the Act, 21 U.S.C. section 360bbb-3(b)(1), unless the authorization is terminated or revoked sooner.    Influenza A by PCR NEGATIVE NEGATIVE Final   Influenza B by PCR NEGATIVE NEGATIVE Final    Comment: (NOTE) The Xpert Xpress SARS-CoV-2/FLU/RSV assay is intended as an aid in  the diagnosis of influenza from Nasopharyngeal swab specimens and  should not be used as a sole basis for treatment. Nasal washings and  aspirates are unacceptable for Xpert Xpress SARS-CoV-2/FLU/RSV  testing. Fact Sheet for Patients: https://www.moore.com/https://www.fda.gov/media/142436/download Fact Sheet for Healthcare Providers: https://www.young.biz/https://www.fda.gov/media/142435/download This test is not yet approved or cleared by the [place] FDA and  has been authorized for detection and/or diagnosis of SARS-CoV-2 by  FDA under an Emergency Use Authorization (EUA). This EUA will remain  in effect (meaning this test can be used) for the duration of the  Covid-19 declaration under Section 564(b)(1) of the Act, 21  U.S.C. section 360bbb-3(b)(1), unless the authorization is  terminated or revoked. Performed at [facility], [address] [place], [place] [address]      Labs: BNP (last 3 results) Recent Labs    06/14/19 1825  BNP 67.1  Basic Metabolic Panel: Recent Labs  Lab 06/17/19 0306 06/18/19 0405 06/19/19 0330 06/20/19 0316 06/21/19 0107  NA 137 138 136 136 134*  K 5.0 4.6 4.5 4.8 5.1  CL 101 102 100 96* 96*  CO2 26 27 28 30 29   GLUCOSE 198* 85 122* 169* 175*  BUN 22 22 18 23  29*  CREATININE 0.99 1.14 1.00 1.08 1.17  CALCIUM 8.8* 8.7* 8.5* 9.0 9.3   Liver Function Tests: Recent Labs  Lab 06/17/19 0306 06/18/19 0405 06/19/19 0330 06/20/19 0316 06/21/19 0107  AST 53* 61* 49* 34 47*  ALT 57*  69* 61* 59* 72*  ALKPHOS 78 82 81 79 95  BILITOT 0.6 0.6 0.9 0.8 0.8  PROT 7.2 6.8 6.3* 6.5 6.9  ALBUMIN 2.8* 2.6* 2.6* 2.8* 3.0*   No results for input(s): LIPASE, AMYLASE in the last 168 hours. No results for input(s): AMMONIA in the last 168 hours. CBC: Recent Labs  Lab 06/15/19 0450 06/16/19 0319 06/17/19 0306 06/18/19 0405 06/19/19 0330  WBC 8.7 8.3 9.4 8.1 7.4  NEUTROABS 7.0 6.2 7.1 4.9 4.4  HGB 14.8 14.6 14.1 15.1 15.2  HCT 46.9 45.7 44.2 47.3 47.6  MCV 90.9 90.3 90.6 89.9 89.6  PLT 339 368 383 363 273   Cardiac Enzymes: No results for input(s): CKTOTAL, CKMB, CKMBINDEX, TROPONINI in the last 168 hours. BNP: Invalid input(s): POCBNP CBG: Recent Labs  Lab 06/15/19 1118 06/15/19 1645 06/18/19 1149 06/20/19 1626 06/20/19 1935  GLUCAP 282* 288* 122* 412* 306*   D-Dimer Recent Labs    06/20/19 0316 06/21/19 0107  DDIMER >20.00* 19.65*   Hgb A1c No results for input(s): HGBA1C in the last 72 hours. Lipid Profile No results for input(s): CHOL, HDL, LDLCALC, TRIG, CHOLHDL, LDLDIRECT in the last 72 hours. Thyroid function studies No results for input(s): TSH, T4TOTAL, T3FREE, THYROIDAB in the last 72 hours.  Invalid input(s): FREET3 Anemia work up [employer]    06/20/19 0316 06/21/19 0107  FERRITIN 1,211* 1,389*   Urinalysis No results found for: COLORURINE, APPEARANCEUR, LABSPEC, PHURINE, GLUCOSEU, HGBUR, BILIRUBINUR, KETONESUR, PROTEINUR, UROBILINOGEN, NITRITE, LEUKOCYTESUR Sepsis Labs Invalid input(s): PROCALCITONIN,  WBC,  LACTICIDVEN Microbiology Recent Results (from the past 240 hour(s))  Respiratory Panel by RT PCR (Flu A&B, Covid) - Nasopharyngeal Swab     Status: Abnormal   Collection Time: 06/14/19  6:25 PM   Specimen: Nasopharyngeal Swab  Result Value Ref Range Status   SARS Coronavirus 2 by RT PCR POSITIVE (A) NEGATIVE Final    Comment: RESULT CALLED TO, READ BACK BY AND VERIFIED WITH: B.BROOKS AT 2054 ON 06/14/19 BY  N.THOMPSON (NOTE) SARS-CoV-2 target nucleic acids are DETECTED. SARS-CoV-2 RNA is generally detectable in upper respiratory specimens  during the acute phase of infection. Positive results are indicative of the presence of the identified virus, but do not rule out bacterial infection or co-infection with other pathogens not detected by the test. Clinical correlation with patient history and other diagnostic information is necessary to determine patient infection status. The expected result is Negative. Fact Sheet for Patients:  https://www.moore.com/ Fact Sheet for Healthcare Providers: https://www.young.biz/ This test is not yet approved or cleared by the [place] FDA and  has been authorized for detection and/or diagnosis of SARS-CoV-2 by FDA under an Emergency Use Authorization (EUA).  This EUA will remain in effect (meaning this test can be [place] ed) for the duration of  the COVID-19 declaration under Section 564(b)(1) of the Act, 21 U.S.C. section 360bbb-3(b)(1), unless the authorization is terminated  or revoked sooner.    Influenza A by PCR NEGATIVE NEGATIVE Final   Influenza B by PCR NEGATIVE NEGATIVE Final    Comment: (NOTE) The Xpert Xpress SARS-CoV-2/FLU/RSV assay is intended as an aid in  the diagnosis of influenza from Nasopharyngeal swab specimens and  should not be used as a sole basis for treatment. Nasal washings and  aspirates are unacceptable for Xpert Xpress SARS-CoV-2/FLU/RSV  testing. Fact Sheet for Patients: https://www.moore.com/https://www.fda.gov/media/142436/download Fact Sheet for Healthcare Providers: https://www.young.biz/https://www.fda.gov/media/142435/download This test is not yet approved or cleared by the [place] FDA and  has been authorized for detection and/or diagnosis of SARS-CoV-2 by  FDA under an Emergency Use Authorization (EUA). This EUA will remain  in effect (meaning this test can be used) for the duration of the  Covid-19  declaration under Section 564(b)(1) of the Act, 21  U.S.C. section 360bbb-3(b)(1), unless the authorization is  terminated or revoked. Performed at [facility], [address] [place], [place] [address]      Time coordinating discharge: 45 minutes  SIGNED:   Coralie KeensMauricio Daniel Nadia Viar, MD  [facility] 06/21/2019, 8:38 AM

## 2019-06-22 DIAGNOSIS — E875 Hyperkalemia: Secondary | ICD-10-CM

## 2019-06-22 LAB — GLUCOSE, CAPILLARY
Glucose-Capillary: 136 mg/dL — ABNORMAL HIGH (ref 70–99)
Glucose-Capillary: 314 mg/dL — ABNORMAL HIGH (ref 70–99)
Glucose-Capillary: 173 mg/dL — ABNORMAL HIGH (ref 70–99)
Glucose-Capillary: 424 mg/dL — ABNORMAL HIGH (ref 70–99)
Glucose-Capillary: 329 mg/dL — ABNORMAL HIGH (ref 70–99)

## 2019-06-22 LAB — COMPREHENSIVE METABOLIC PANEL
ALT: 89 U/L — ABNORMAL HIGH (ref 0–44)
AST: 56 U/L — ABNORMAL HIGH (ref 15–41)
Albumin: 3 g/dL — ABNORMAL LOW (ref 3.5–5.0)
Alkaline Phosphatase: 94 U/L (ref 38–126)
Anion gap: 11 (ref 5–15)
BUN: 27 mg/dL — ABNORMAL HIGH (ref 8–23)
CO2: 29 mmol/L (ref 22–32)
Calcium: 9.4 mg/dL (ref 8.9–10.3)
Chloride: 95 mmol/L — ABNORMAL LOW (ref 98–111)
Creatinine, Ser: 1.06 mg/dL (ref 0.61–1.24)
GFR calc Af Amer: >60 mL/min (ref >60)
GFR calc non Af Amer: >60 mL/min (ref >60)
Glucose, Bld: 180 mg/dL — ABNORMAL HIGH (ref 70–99)
Potassium: 5.4 mmol/L — ABNORMAL HIGH (ref 3.5–5.1)
Sodium: 135 mmol/L (ref 135–145)
Total Bilirubin: 0.7 mg/dL (ref 0.3–1.2)
Total Protein: 6.8 g/dL (ref 6.5–8.1)

## 2019-06-22 LAB — D-DIMER, QUANTITATIVE: D-Dimer, Quant: 12.33 ug/mL-FEU — ABNORMAL HIGH (ref 0.00–0.50)

## 2019-06-22 LAB — BASIC METABOLIC PANEL
Anion gap: 12 (ref 5–15)
BUN: 30 mg/dL — ABNORMAL HIGH (ref 8–23)
CO2: 28 mmol/L (ref 22–32)
Calcium: 9.2 mg/dL (ref 8.9–10.3)
Chloride: 91 mmol/L — ABNORMAL LOW (ref 98–111)
Creatinine, Ser: 1.14 mg/dL (ref 0.61–1.24)
GFR calc Af Amer: >60 mL/min (ref >60)
GFR calc non Af Amer: >60 mL/min (ref >60)
Glucose, Bld: 364 mg/dL — ABNORMAL HIGH (ref 70–99)
Potassium: 5 mmol/L (ref 3.5–5.1)
Sodium: 131 mmol/L — ABNORMAL LOW (ref 135–145)

## 2019-06-22 LAB — POTASSIUM: Potassium: 5.8 mmol/L — ABNORMAL HIGH (ref 3.5–5.1)

## 2019-06-22 LAB — FERRITIN: Ferritin: 1399 ng/mL — ABNORMAL HIGH (ref 24–336)

## 2019-06-22 LAB — C-REACTIVE PROTEIN: CRP: 1.7 mg/dL — ABNORMAL HIGH (ref <1.0)

## 2019-06-22 MED ORDER — CALCIUM GLUCONATE-NACL 1-0.675 GM/50ML-% IV SOLN
1.0000 g | Freq: Once | INTRAVENOUS | Status: AC
  Administered 2019-06-22: 1000 mg via INTRAVENOUS
  Filled 2019-06-22: qty 50

## 2019-06-22 MED ORDER — FUROSEMIDE 10 MG/ML IJ SOLN
40.0000 mg | Freq: Once | INTRAMUSCULAR | Status: AC
  Administered 2019-06-22: 40 mg via INTRAVENOUS
  Filled 2019-06-22: qty 4

## 2019-06-22 MED ORDER — DEXTROSE 50 % IV SOLN
1.0000 | Freq: Once | INTRAVENOUS | Status: AC
  Administered 2019-06-22: 50 mL via INTRAVENOUS
  Filled 2019-06-22: qty 50

## 2019-06-22 MED ORDER — METFORMIN HCL 500 MG PO TABS
500.0000 mg | ORAL_TABLET | Freq: Every day | ORAL | Status: DC
  Administered 2019-06-23 – 2019-06-24 (×2): 500 mg via ORAL
  Filled 2019-06-22 (×2): qty 1

## 2019-06-22 MED ORDER — INSULIN ASPART 100 UNIT/ML IV SOLN
10.0000 [IU] | Freq: Once | INTRAVENOUS | Status: AC
  Administered 2019-06-22: 10 [IU] via INTRAVENOUS

## 2019-06-22 MED ORDER — SODIUM ZIRCONIUM CYCLOSILICATE 10 G PO PACK
10.0000 g | PACK | Freq: Three times a day (TID) | ORAL | Status: DC
  Administered 2019-06-22 – 2019-06-25 (×11): 10 g via ORAL
  Filled 2019-06-22 (×14): qty 1

## 2019-06-22 NOTE — TOC Progression Note (Signed)
Transition of Care [facility]) - Progression Note    Patient Details  Name: Joseph Todd MRN: [id number] Date of Birth: [birth year]  Transition of Care [facility]) CM/SW Contact  Tennis Must [place], [place] Phone Number: 06/22/2019, 1:02 PM  Clinical Narrative:   Patient's oxygen has still not been approved/delivered, and CSW will be unable to reach any [facility] representative until Tuesday, as it is the weekend and Monday is a holiday. CSW to continue to follow.    Expected Discharge Plan: Home w Home Health Services Barriers to Discharge: Equipment Delay  Expected Discharge Plan and Services Expected Discharge Plan: Home w Home Health Services         Expected Discharge Date: 06/22/19               DME Arranged: Oxygen, 3-N-1 DME Agency: [facility] [place] Date DME Agency Contacted: 06/21/19 Time DME Agency Contacted: 1345 Representative spoke with at DME Agency: Left message HH Arranged: PT, OT, RN HH Agency: [facility] Date [facility] Agency Contacted: 06/21/19 Time HH Agency Contacted: 1200 Representative spoke with at [facility] Agency: Amy   Social Determinants of Health (SDOH) Interventions    Readmission Risk Interventions No flowsheet data found.

## 2019-06-22 NOTE — Plan of Care (Signed)
  Problem: Education: Goal: Knowledge of risk factors and measures for prevention of condition will improve Outcome: Progressing   Problem: Coping: Goal: Psychosocial and spiritual needs will be supported Outcome: Progressing   Problem: Respiratory: Goal: Will maintain a patent airway Outcome: Progressing Goal: Complications related to the disease process, condition or treatment will be avoided or minimized Outcome: Progressing   

## 2019-06-22 NOTE — Progress Notes (Signed)
PROGRESS NOTE    Joseph Todd  ZHG:[id number] DOB: 01/26/1956 DOA: 06/14/2019 PCP: Patient, No Pcp Per    Brief Narrative:  Patient was admitted to the hospital with a working diagnosis of acute hypoxic respiratory failure due to SARS COVID-19 viral pneumonia  64 year old male who presented with dyspnea. He does have significant past medical history for type 2 diabetes mellitus. He reported 1 week of dyspnea, generalized weakness and nausea,associated with subjective fevers.On his initial physical examination he was in respiratory distress, respiratory rate 34, heart rate 101, oxygen saturation 86%, blood pressure 116/80.His lungs were clear to auscultation, no wheezing or rhonchi, heart S1-S2 present and rhythmic, soft abdomen, no lower extremity edema.SARS COVID-19 positive. Chest radiograph with bilateral interstitial infiltrates at bases. Sodium 132, potassium 4.2, chloride 96, bicarb 25, glucose 212, BUN 15, creatinine 1.0.  White count 8.3, hemoglobin 15.0, hematocrit 44.9, platelets 274.  CT chest negative for pulmonary embolism, bilateral subpleural groundglass opacities,positive subpleural blebs.EKG 110 bpm, normal axis, normal intervals, sinus rhythm, no ST segment or T wave changes.  Patient has received supplemental oxygen per high flow nasal cannula, systemic or steroids, COVID-19 convalescent plasma and Tocilizumabx2.  Oxygen requirements have been improving. Lower extremity US negative for DVT.  Patient waiting for home 02 to be approved by the [place] before can be safely discharge home.    Assessment & Plan:   Principal Problem:   Pneumonia due to COVID-19 virus Active Problems:   DM (diabetes mellitus) (HCC)   Acute respiratory failure due to COVID-19 (HCC)   Hyperkalemia   1. Acute hypoxic respiratory failure due to SARS COVID 19 viral pneumonia. Sp #5/5 remdesivir, tocilizumab x2 (02/06 and 02/07), covid 19 convalescent plasma 02/07.  RR: 18  Pulse  oxymetry: 94%  Fi02: 1 L/min per   COVID-19 Labs  Recent Labs    06/20/19 0316 06/21/19 0107 06/22/19 0150  DDIMER >20.00* 19.65* 12.33*  FERRITIN 1,211* 1,389* 1,399*  CRP 1.1* 1.4* 1.7*    Lab Results  Component Value Date   SARSCOV2NAA POSITIVE (A) 06/14/2019   SARSCOV2NAA NEGATIVE 06/09/2019    Patient has completed medical therapy for COVID pneumonia. Pending home 02 arrangements before he can be discharged home. Will continue with airway clearing techniques, flutter valve and incentive spirometer along with as needed antitussive agents and bronchodilators.   Out of bed, physical and occupation therapy.   2. Uncontrolled T2DM (Hgb A1c 11,9) with steroid induced hyperglycemia.Today's fasting glucose 180. Will resume home regimen with metformin. Continue glucose cover and monitoring with insulin sliding scale for now,   3. HTN. Blood pressure 101/77, at home not on antihypertensive agents, will dc metoprolol for now. Continue close monitoring.   4. New hyperkalemia. K up to 5,4, renal function with serum cr at 1,0 and serum bicarbonate at 29. Will order IV insulin with dextrose along with calcium gluconate. Sodium zirconium and follow K this pm.   DVT prophylaxis:enoxaparin Code Status:full Family Communication:no family at the bedside Disposition Plan/ discharge barriers:dc home when home 02 arranged.   Subjective: Patient is feeling better, continue to be weak and deconditioned, but feels improving slowly, no nausea or vomiting, no chest pain. Pending home 02 to be arranged.   Objective: Vitals:   06/21/19 1713 06/21/19 1932 06/22/19 0433 06/22/19 0800  BP:  119/80 104/73 101/77  Pulse:  (!) 109 92 95  Resp: 18 18 20 18   Temp:  (!) 97.1 F (36.2 C) 98.3 F (36.8 C) 97.6 F (36.4 C)  TempSrc:  Oral Oral Oral  SpO2:  95% 94% 94%  Weight:      Height:        Intake/Output Summary (Last 24 hours) at 06/22/2019 1147 Last data filed at 06/21/2019  2045 Gross per 24 hour  Intake 840 ml  Output --  Net 840 ml   Filed Weights   06/14/19 1801 06/15/19 0124  Weight: 80.7 kg 79.9 kg    Examination:   General: Not in pain or dyspnea. Neurology: Awake and alert, non focal  E ENT: no pallor, no icterus, oral mucosa moist Cardiovascular: No JVD. S1-S2 present, rhythmic, no gallops, rubs, or murmurs. No lower extremity edema. Pulmonary: positive breath sounds bilaterally,. Gastrointestinal. Abdomen with no organomegaly, non tender, no rebound or guarding Skin. No rashes Musculoskeletal: no joint deformities     Data Reviewed: I have personally reviewed following labs and imaging studies  CBC: Recent Labs  Lab 06/16/19 0319 06/17/19 0306 06/18/19 0405 06/19/19 0330  WBC 8.3 9.4 8.1 7.4  NEUTROABS 6.2 7.1 4.9 4.4  HGB 14.6 14.1 15.1 15.2  HCT 45.7 44.2 47.3 47.6  MCV 90.3 90.6 89.9 89.6  PLT 368 383 363 790   Basic Metabolic Panel: Recent Labs  Lab 06/18/19 0405 06/19/19 0330 06/20/19 0316 06/21/19 0107 06/22/19 0150  NA 138 136 136 134* 135  K 4.6 4.5 4.8 5.1 5.4*  CL 102 100 96* 96* 95*  CO2 27 28 30 29 29   GLUCOSE 85 122* 169* 175* 180*  BUN 22 18 23  29* 27*  CREATININE 1.14 1.00 1.08 1.17 1.06  CALCIUM 8.7* 8.5* 9.0 9.3 9.4   GFR: Estimated Creatinine Clearance: 70.8 mL/min (by C-G formula based on SCr of 1.06 mg/dL). Liver Function Tests: Recent Labs  Lab 06/18/19 0405 06/19/19 0330 06/20/19 0316 06/21/19 0107 06/22/19 0150  AST 61* 49* 34 47* 56*  ALT 69* 61* 59* 72* 89*  ALKPHOS 82 81 79 95 94  BILITOT 0.6 0.9 0.8 0.8 0.7  PROT 6.8 6.3* 6.5 6.9 6.8  ALBUMIN 2.6* 2.6* 2.8* 3.0* 3.0*   No results for input(s): LIPASE, AMYLASE in the last 168 hours. No results for input(s): AMMONIA in the last 168 hours. Coagulation Profile: No results for input(s): INR, PROTIME in the last 168 hours. Cardiac Enzymes: No results for input(s): CKTOTAL, CKMB, CKMBINDEX, TROPONINI in the last 168 hours. BNP  (last 3 results) No results for input(s): PROBNP in the last 8760 hours. HbA1C: No results for input(s): HGBA1C in the last 72 hours. CBG: Recent Labs  Lab 06/15/19 1645 06/18/19 1149 06/20/19 1626 06/20/19 1935 06/22/19 0829  GLUCAP 288* 122* 412* 306* 136*   Lipid Profile: No results for input(s): CHOL, HDL, LDLCALC, TRIG, CHOLHDL, LDLDIRECT in the last 72 hours. Thyroid Function Tests: No results for input(s): TSH, T4TOTAL, FREET4, T3FREE, THYROIDAB in the last 72 hours. Anemia Panel: Recent Labs    06/21/19 0107 06/22/19 0150  FERRITIN 1,389* 1,399*      Radiology Studies: I have reviewed all of the imaging during this hospital visit personally     Scheduled Meds: . vitamin C  500 mg Oral Daily  . chlorpheniramine-HYDROcodone  5 mL Oral Q12H  . dexamethasone (DECADRON) injection  6 mg Intravenous Daily  . enoxaparin (LOVENOX) injection  40 mg Subcutaneous BID  . fluticasone  2 spray Each Nare BID  . insulin aspart  0-5 Units Subcutaneous QHS  . insulin aspart  0-9 Units Subcutaneous TID WC  . insulin detemir  10 Units Subcutaneous QHS  .  Ipratropium-Albuterol  1 puff Inhalation Q6H  . linagliptin  5 mg Oral Daily  . metoprolol tartrate  25 mg Oral BID  . senna-docusate  2 tablet Oral BID  . sodium chloride flush  3 mL Intravenous Q12H  . sodium zirconium cyclosilicate  10 g Oral TID  . zinc sulfate  220 mg Oral Daily   Continuous Infusions: . sodium chloride    . calcium gluconate 1,000 mg (06/22/19 1112)     LOS: 8 days        Johanan Skorupski Annett Gula, MD

## 2019-06-23 LAB — BASIC METABOLIC PANEL
Anion gap: 10 (ref 5–15)
BUN: 25 mg/dL — ABNORMAL HIGH (ref 8–23)
CO2: 31 mmol/L (ref 22–32)
Calcium: 8.6 mg/dL — ABNORMAL LOW (ref 8.9–10.3)
Chloride: 89 mmol/L — ABNORMAL LOW (ref 98–111)
Creatinine, Ser: 1.11 mg/dL (ref 0.61–1.24)
GFR calc Af Amer: >60 mL/min (ref >60)
GFR calc non Af Amer: >60 mL/min (ref >60)
Glucose, Bld: 230 mg/dL — ABNORMAL HIGH (ref 70–99)
Potassium: 4.4 mmol/L (ref 3.5–5.1)
Sodium: 130 mmol/L — ABNORMAL LOW (ref 135–145)

## 2019-06-23 LAB — GLUCOSE, CAPILLARY
Glucose-Capillary: 204 mg/dL — ABNORMAL HIGH (ref 70–99)
Glucose-Capillary: 207 mg/dL — ABNORMAL HIGH (ref 70–99)
Glucose-Capillary: 442 mg/dL — ABNORMAL HIGH (ref 70–99)
Glucose-Capillary: 364 mg/dL — ABNORMAL HIGH (ref 70–99)
Glucose-Capillary: 289 mg/dL — ABNORMAL HIGH (ref 70–99)

## 2019-06-23 MED ORDER — METOPROLOL TARTRATE 25 MG PO TABS
25.0000 mg | ORAL_TABLET | Freq: Two times a day (BID) | ORAL | Status: DC
  Administered 2019-06-23 – 2019-06-24 (×3): 25 mg via ORAL
  Filled 2019-06-23 (×3): qty 1

## 2019-06-23 NOTE — Plan of Care (Signed)
  Problem: Education: Goal: Knowledge of risk factors and measures for prevention of condition will improve Outcome: Progressing   Problem: Coping: Goal: Psychosocial and spiritual needs will be supported Outcome: Progressing   Problem: Respiratory: Goal: Will maintain a patent airway Outcome: Progressing Goal: Complications related to the disease process, condition or treatment will be avoided or minimized Outcome: Progressing   

## 2019-06-23 NOTE — Progress Notes (Signed)
PROGRESS NOTE    Joseph Todd  BTD:[id number] DOB: 06/01/1955 DOA: 06/14/2019 PCP: Patient, No Pcp Per    Brief Narrative:  Patient was admitted to the hospital with a working diagnosis of acute hypoxic respiratory failure due to SARS COVID-19 viral pneumonia  64 year old male who presented with dyspnea. He does have significant past medical history for type 2 diabetes mellitus. He reported 1 week of dyspnea, generalized weakness and nausea,associated with subjective fevers.On his initial physical examination he was in respiratory distress, respiratory rate 34, heart rate 101, oxygen saturation 86%, blood pressure 116/80.His lungswereclear to auscultation, no wheezing or rhonchi, heart S1-S2 present and rhythmic, soft abdomen, no lower extremity edema.SARS COVID-19 positive. Chest radiograph with bilateral interstitial infiltrates at bases. Sodium 132, potassium 4.2, chloride 96, bicarb 25, glucose 212, BUN 15, creatinine 1.0.White count 8.3, hemoglobin 15.0, hematocrit 44.9, platelets 274. CT chest negative for pulmonary embolism, bilateral subpleural groundglass opacities,positive subpleural blebs.EKG 110 bpm, normal axis, normal intervals, sinus rhythm, no ST segment or T wave changes.  Patient has received supplemental oxygen per high flow nasal cannula, systemic or steroids, COVID-19 convalescent plasma and Tocilizumabx2.  Oxygen requirements have been improving.Lower extremity US negative for DVT.  Patient waiting for home 02 to be approved by the [place] before can be safely discharge home.    Assessment & Plan:   Principal Problem:   Pneumonia due to COVID-19 virus Active Problems:   DM (diabetes mellitus) (HCC)   Acute respiratory failure due to COVID-19 (HCC)   Hyperkalemia   1. Acute hypoxic respiratory failure due to SARS COVID 19 viral pneumonia. Sp #5/5 remdesivir, tocilizumab x2 (02/06 and 02/07), covid 19 convalescent plasma 02/07.  RR: 20  Pulse  oxymetry: 97%  Fi02: 2 L/ min per [place]  Patient has completed medical therapy for COVID pneumonia. Pending home 02 arrangements before he can be discharged home.   On airway clearing techniques, flutter valve and incentive spirometer. Continue with as needed antitussive agents and bronchodilators.  Continue to encourage out of bed to chair.   2. Uncontrolled T2DM (Hgb A1c 11,9) with steroid induced hyperglycemia. Patient no off steroids, metformin has been resumed. Will continue glucose cover and monitoring with sliding scale for now.   3. HTN. Patient off antihypertensives, blood pressure 112/77.   4. New hyperkalemia. Follow up K last night down to 5.0, with stable renal function, serum cr 1,14. Will follow on renal panel today.   DVT prophylaxis:enoxaparin Code Status:full Family Communication:no family at the bedside Disposition Plan/ discharge barriers:dc home when home 02 arranged.      Subjective: Patient continue to feel better, but not yet back to baseline, continue to have dyspnea with exertion, no nausea or vomiting, tolerating po well. Continue to wait for home 02.   Objective: Vitals:   06/22/19 2327 06/23/19 0303 06/23/19 0405 06/23/19 0712  BP:  99/79 103/81 112/77  Pulse: 98 99 94 99  Resp:  20 20 20   Temp:  98.2 F (36.8 C) 98.2 F (36.8 C) (!) 97.2 F (36.2 C)  TempSrc:  Oral Oral Axillary  SpO2: 97% 98% 97%   Weight:      Height:        Intake/Output Summary (Last 24 hours) at 06/23/2019 0907 Last data filed at 06/22/2019 2300 Gross per 24 hour  Intake 480 ml  Output 400 ml  Net 80 ml   Filed Weights   06/14/19 1801 06/15/19 0124  Weight: 80.7 kg 79.9 kg    Examination:  General: deconditioned  Neurology: Awake and alert, non focal  E ENT: no pallor, no icterus, oral mucosa moist Cardiovascular: No JVD. S1-S2 present, rhythmic, no gallops, rubs, or murmurs. No lower extremity edema. Pulmonary: positive breath sounds bilaterally, no  wheezing, rhonchi or rales. Gastrointestinal. Abdomen with no organomegaly, non tender, no rebound or guarding Skin. No rashes Musculoskeletal: no joint deformities     Data Reviewed: I have personally reviewed following labs and imaging studies  CBC: Recent Labs  Lab 06/17/19 0306 06/18/19 0405 06/19/19 0330  WBC 9.4 8.1 7.4  NEUTROABS 7.1 4.9 4.4  HGB 14.1 15.1 15.2  HCT 44.2 47.3 47.6  MCV 90.6 89.9 89.6  PLT 383 363 273   Basic Metabolic Panel: Recent Labs  Lab 06/19/19 0330 06/19/19 0330 06/20/19 0316 06/21/19 0107 06/22/19 0150 06/22/19 1535 06/22/19 2135  NA 136  --  136 134* 135  --  131*  K 4.5   < > 4.8 5.1 5.4* 5.8* 5.0  CL 100  --  96* 96* 95*  --  91*  CO2 28  --  30 29 29   --  28  GLUCOSE 122*  --  169* 175* 180*  --  364*  BUN 18  --  23 29* 27*  --  30*  CREATININE 1.00  --  1.08 1.17 1.06  --  1.14  CALCIUM 8.5*  --  9.0 9.3 9.4  --  9.2   < > = values in this interval not displayed.   GFR: Estimated Creatinine Clearance: 65.8 mL/min (by C-G formula based on SCr of 1.14 mg/dL). Liver Function Tests: Recent Labs  Lab 06/18/19 0405 06/19/19 0330 06/20/19 0316 06/21/19 0107 06/22/19 0150  AST 61* 49* 34 47* 56*  ALT 69* 61* 59* 72* 89*  ALKPHOS 82 81 79 95 94  BILITOT 0.6 0.9 0.8 0.8 0.7  PROT 6.8 6.3* 6.5 6.9 6.8  ALBUMIN 2.6* 2.6* 2.8* 3.0* 3.0*   No results for input(s): LIPASE, AMYLASE in the last 168 hours. No results for input(s): AMMONIA in the last 168 hours. Coagulation Profile: No results for input(s): INR, PROTIME in the last 168 hours. Cardiac Enzymes: No results for input(s): CKTOTAL, CKMB, CKMBINDEX, TROPONINI in the last 168 hours. BNP (last 3 results) No results for input(s): PROBNP in the last 8760 hours. HbA1C: No results for input(s): HGBA1C in the last 72 hours. CBG: Recent Labs  Lab 06/22/19 1247 06/22/19 2028 06/22/19 2145 06/23/19 0608 06/23/19 0711  GLUCAP 173* 424* 329* 204* 207*   Lipid  Profile: No results for input(s): CHOL, HDL, LDLCALC, TRIG, CHOLHDL, LDLDIRECT in the last 72 hours. Thyroid Function Tests: No results for input(s): TSH, T4TOTAL, FREET4, T3FREE, THYROIDAB in the last 72 hours. Anemia Panel: Recent Labs    06/21/19 0107 06/22/19 0150  FERRITIN 1,389* 1,399*      Radiology Studies: I have reviewed all of the imaging during this hospital visit personally     Scheduled Meds: . vitamin C  500 mg Oral Daily  . chlorpheniramine-HYDROcodone  5 mL Oral Q12H  . dexamethasone (DECADRON) injection  6 mg Intravenous Daily  . enoxaparin (LOVENOX) injection  40 mg Subcutaneous BID  . fluticasone  2 spray Each Nare BID  . insulin aspart  0-9 Units Subcutaneous TID WC  . Ipratropium-Albuterol  1 puff Inhalation Q6H  . metFORMIN  500 mg Oral Q breakfast  . senna-docusate  2 tablet Oral BID  . sodium chloride flush  3 mL Intravenous Q12H  .  sodium zirconium cyclosilicate  10 g Oral TID  . zinc sulfate  220 mg Oral Daily   Continuous Infusions: . sodium chloride       LOS: 9 days        Jasyah Theurer Gerome Apley, MD

## 2019-06-23 NOTE — Progress Notes (Signed)
Physical Therapy Treatment Patient Details Name: Joseph Todd MRN: [id number] DOB: 1956-01-08 Today's Date: 06/23/2019    History of Present Illness Joseph Todd is a 64 year old African-American male with past medical history remarkable for type 2 diabetes mellitus who presented to the ED with 1 week history of weakness, shortness of breath and nausea.  Patient reports dyspnea has been progressive with subjective fevers.  Denies vomiting/diarrhea, no chest pain, no abdominal pain. Recently hospitalized a week ago with syncope, Covid-19 PCR was negative at that time. In the ED, patient was noted to be hypoxic requiring nonrebreather to maintain adequate oxygenation.  ED PA referred for admission for Covid pneumonia with hypoxia.    PT Comments    He was in bed resting when PT arrived. States that he knows he just "has to let go and let the system take it's course" as the main hold up is getting his oxygen delivered to his home. He will need oxygen support at least temporarily as he continues to recover from COVID and return to all regular ADLs. Reviewed all ex format. He has been ambulating in his room (ie to the bathroom around the room to the window, etc). He should continue to benefit from PT to progress toward optimal functional outcomes. At end of session, he was in [facility] chair with BS table in reach, along with remote/call light and telephone.   Follow Up Recommendations  Home health PT     Equipment Recommendations       Recommendations for Other Services       Precautions / Restrictions Precautions Precautions: Other (comment) Precaution Comments: Monitor SpO2 Restrictions Weight Bearing Restrictions: No Other Position/Activity Restrictions: Monitor closely as noted above related to O2 sats and HR..    Mobility  Bed Mobility Overal bed mobility: Modified Independent Bed Mobility: Supine to Sit     Supine to sit: Supervision;HOB elevated     General bed mobility comments:  Patient initially in bed when PT arrived. States he was up in chair most of the morning  Transfers Overall transfer level: Needs assistance Equipment used: None Transfers: Sit to/from Stand Sit to Stand: Supervision         General transfer comment: Supervision to ensure safety with O2 tubing  Ambulation/Gait Ambulation/Gait assistance: Min guard;Supervision Gait Distance (Feet): 12 Feet(bed to chair  Patient has been ambulating independently in his room several times daily) Assistive device: None Gait Pattern/deviations: Narrow base of support;Shuffle         Stairs             Wheelchair Mobility    Modified Rankin (Stroke Patients Only)       Balance Overall balance assessment: Mild deficits observed, not formally tested                                          Cognition Arousal/Alertness: Awake/alert Behavior During Therapy: WFL for tasks assessed/performed Overall Cognitive Status: Within Functional Limits for tasks assessed                                 General Comments: Very pleasant- states he had been practicing his IS and pursed lip breathing. He also indicated that he did not want to "get too exited about going home due to issues with his heart"   Possible discharge 2/15 if  oxygen equipment gets delivered.      Exercises General Exercises - Lower Extremity Ankle Circles/Pumps: Seated;AROM Short Arc Quad: AROM;Seated Hip ABduction/ADduction: AROM;Seated Hip Flexion/Marching: AROM;Seated Other Exercises Other Exercises: Encouraged pursed lip breathing throughout Other Exercises: Reminded to perform ankle pumps frequently both in seated and while in bed. Other Exercises: Practiced IS and FLutter Valve unit with good return demo    General Comments General comments (skin integrity, edema, etc.): He as in good spirits today- but did say- " I just have to let the system work"- main hold up is getting my oxygen- am  really feeling much better overall      Pertinent Vitals/Pain Pain Assessment: No/denies pain    Home Living                      Prior Function            PT Goals (current goals can now be found in the care plan section) Acute Rehab PT Goals Patient Stated Goal: to go home PT Goal Formulation: With patient Time For Goal Achievement: 06/30/19 Potential to Achieve Goals: Good Progress towards PT goals: Progressing toward goals    Frequency           PT Plan      Co-evaluation              AM-PAC PT "6 Clicks" Mobility   Outcome Measure  Help needed turning from your back to your side while in a flat bed without using bedrails?: None Help needed moving from lying on your back to sitting on the side of a flat bed without using bedrails?: A Little Help needed moving to and from a bed to a chair (including a wheelchair)?: A Little Help needed standing up from a chair using your arms (e.g., wheelchair or bedside chair)?: A Little Help needed to walk in hospital room?: A Little Help needed climbing 3-5 steps with a railing? : A Little 6 Click Score: 19    End of Session   Activity Tolerance: Patient tolerated treatment well Patient left: with call bell/phone within reach;in chair Nurse Communication: Mobility status PT Visit Diagnosis: Muscle weakness (generalized) (M62.81);Unsteadiness on feet (R26.81)     Time: 0132-0207 PT Time Calculation (min) (ACUTE ONLY): 35 min  Charges:  $Therapeutic Exercise: 23-37 mins                     Joseph Todd, PT # [phone] CGV cell  Casandra Doffing 06/23/2019, 4:51 PM

## 2019-06-24 LAB — GLUCOSE, CAPILLARY
Glucose-Capillary: 210 mg/dL — ABNORMAL HIGH (ref 70–99)
Glucose-Capillary: 282 mg/dL — ABNORMAL HIGH (ref 70–99)

## 2019-06-24 MED ORDER — METFORMIN HCL 500 MG PO TABS
500.0000 mg | ORAL_TABLET | Freq: Two times a day (BID) | ORAL | Status: DC
  Administered 2019-06-24 – 2019-06-25 (×4): 500 mg via ORAL
  Filled 2019-06-24 (×4): qty 1

## 2019-06-24 NOTE — Progress Notes (Signed)
Occupational Therapy Treatment Patient Details Name: Joseph Todd MRN: [id number] DOB: 18-Apr-1956 Today's Date: 06/24/2019    History of present illness Reyan Helle is a 64 year old African-American male with past medical history remarkable for type 2 diabetes mellitus who presented to the ED with 1 week history of weakness, shortness of breath and nausea.  Patient reports dyspnea has been progressive with subjective fevers.  Denies vomiting/diarrhea, no chest pain, no abdominal pain. Recently hospitalized a week ago with syncope, Covid-19 PCR was negative at that time. In the ED, patient was noted to be hypoxic requiring nonrebreather to maintain adequate oxygenation.  ED PA referred for admission for Covid pneumonia with hypoxia.   OT comments  Pt progressing towards established OT goals. Pt performing functional mobility in hallway to simulated home distance with Supervision. Providing education on energy conservation and reviewed handout; pt verbalized understanding. Pt SpO2 fluctuating between 93-87% on RA with monitor on earlobe; however, poor wave for during mobility. Continue to recommend dc to home and will continue to follow acutely as admitted.    Follow Up Recommendations  No OT follow up;Supervision - Intermittent    Equipment Recommendations  3 in 1 bedside commode(for use in shower)    Recommendations for Other Services      Precautions / Restrictions Precautions Precautions: Other (comment) Precaution Comments: Monitor SpO2 Restrictions Weight Bearing Restrictions: No Other Position/Activity Restrictions: Monitor closely as noted above related to O2 sats and HR..       Mobility Bed Mobility Overal bed mobility: Modified Independent Bed Mobility: Supine to Sit;Sit to Supine           General bed mobility comments: Increased time.   Transfers Overall transfer level: Needs assistance Equipment used: None Transfers: Sit to/from Stand Sit to Stand:  Supervision         General transfer comment: Supervision for safety    Balance Overall balance assessment: Mild deficits observed, not formally tested                                         ADL either performed or assessed with clinical judgement   ADL Overall ADL's : Needs assistance/impaired                         Toilet Transfer: Modified Independent;Ambulation(simulated in room) Toilet Transfer Details (indicate cue type and reason): Increased time         Functional mobility during ADLs: Supervision/safety(hallway distance) General ADL Comments: Pt performing functional mobility in hallway to simulate home distance. SpO2 from earlobe flucuating between 93-87% on RA; however waveform poor during mobility.      Vision       Perception     Praxis      Cognition Arousal/Alertness: Awake/alert Behavior During Therapy: WFL for tasks assessed/performed Overall Cognitive Status: Within Functional Limits for tasks assessed                                 General Comments: Very motivated and agreeable to participate in therapy        Exercises Exercises: Other exercises Other Exercises Other Exercises: 10 reps flutter valve Other Exercises: 10 reps IS   Shoulder Instructions       General Comments Reviewed EC handout and pt verablize techniques he can implement at home  Pertinent Vitals/ Pain       Pain Assessment: No/denies pain  Home Living                                          Prior Functioning/Environment              Frequency  Min 3X/week        Progress Toward Goals  OT Goals(current goals can now be found in the care plan section)  Progress towards OT goals: Progressing toward goals  Acute Rehab OT Goals Patient Stated Goal: to go home Time For Goal Achievement: 06/30/19 Potential to Achieve Goals: Good ADL Goals Pt Will Perform Grooming:  Independently;standing Pt Will Perform Lower Body Bathing: Independently;sit to/from stand Pt Will Perform Lower Body Dressing: Independently;sit to/from stand Pt Will Transfer to Toilet: Independently;ambulating;regular height toilet Pt Will Perform Toileting - Clothing Manipulation and hygiene: Independently;sit to/from stand Additional ADL Goal #1: Pt to tolerate standing up to 10 min independently with SpO2 maintaining above 90%, in preparation for ADLs. Additional ADL Goal #2: Pt to recall and verbalize 3 energy conservation techniques with 0 verbal cues.  Plan Discharge plan remains appropriate    Co-evaluation                 AM-PAC OT "6 Clicks" Daily Activity     Outcome Measure   Help from another person eating meals?: None Help from another person taking care of personal grooming?: A Little Help from another person toileting, which includes using toliet, bedpan, or urinal?: None Help from another person bathing (including washing, rinsing, drying)?: A Little Help from another person to put on and taking off regular upper body clothing?: A Little Help from another person to put on and taking off regular lower body clothing?: A Little 6 Click Score: 20    End of Session    OT Visit Diagnosis: Unsteadiness on feet (R26.81);Muscle weakness (generalized) (M62.81)   Activity Tolerance Patient tolerated treatment well   Patient Left in chair;with call bell/phone within reach   Nurse Communication Mobility status        Time: 0263-7858 OT Time Calculation (min): 22 min  Charges: OT General Charges $OT Visit: 1 Visit OT Treatments $Self Care/Home Management : 8-22 mins  [place], OTR/L Acute Rehab Pager: [phone] Office: [place] 06/24/2019, 5:11 PM

## 2019-06-24 NOTE — Progress Notes (Signed)
Per previous RN, pt BG levels on The freestyle Libre not consistent and inaccurate in comparison to capillary BG levels and pt has been checked via capillary finger stick. Will continue with capillary finger sticks at this time.

## 2019-06-24 NOTE — Progress Notes (Signed)
Pt able to self-administer Lovenox and insulin injections w/RN supervision. Pt demonstrated correct administration techniques along w/cleansing of the sites. Pt reminded to push Lovenox plunger enough to allow for retraction of needle to ensure proper medication administration. Pt verbalized understanding. Nothing further to note.

## 2019-06-24 NOTE — Progress Notes (Signed)
SATURATION QUALIFICATIONS: (This note is used to comply with regulatory documentation for home oxygen)  Patient Saturations on Room Air at Rest = 94%  Patient Saturations on Room Air while Ambulating = 86%  Patient Saturations on 2 Liters of oxygen while Ambulating =94%  Please briefly explain why patient needs home oxygen: pt requiring 2L 02 to complete ADL's

## 2019-06-24 NOTE — Progress Notes (Signed)
PROGRESS NOTE    Joseph Todd  GEZ:[id number] DOB: 05-02-1956 DOA: 06/14/2019 PCP: Patient, No Pcp Per    Brief Narrative:  Patient was admitted to the hospital with a working diagnosis of acute hypoxic respiratory failure due to SARS COVID-19 viral pneumonia  64 year old male who presented with dyspnea. He does have significant past medical history for type 2 diabetes mellitus. He reported 1 week of dyspnea, generalized weakness and nausea,associated with subjective fevers.On his initial physical examination he was in respiratory distress, respiratory rate 34, heart rate 101, oxygen saturation 86%, blood pressure 116/80.His lungswereclear to auscultation, no wheezing or rhonchi, heart S1-S2 present and rhythmic, soft abdomen, no lower extremity edema.SARS COVID-19 positive. Chest radiograph with bilateral interstitial infiltrates at bases. Sodium 132, potassium 4.2, chloride 96, bicarb 25, glucose 212, BUN 15, creatinine 1.0.White count 8.3, hemoglobin 15.0, hematocrit 44.9, platelets 274. CT chest negative for pulmonary embolism, bilateral subpleural groundglass opacities,positive subpleural blebs.EKG 110 bpm, normal axis, normal intervals, sinus rhythm, no ST segment or T wave changes.  Patient has received supplemental oxygen per high flow nasal cannula, systemic or steroids, COVID-19 convalescent plasma and Tocilizumabx2.  Oxygen requirements have been improving.Lower extremity US negative for DVT.  Patient waiting for home 02 to be approved by the [place] before can be safely discharge home.   Assessment & Plan:   Principal Problem:   Pneumonia due to COVID-19 virus Active Problems:   DM (diabetes mellitus) (HCC)   Acute respiratory failure due to COVID-19 (HCC)   Hyperkalemia  1. Acute hypoxic respiratory failure due to SARS COVID 19 viral pneumonia. Sp #5/5 remdesivir, tocilizumab x2 (02/06 and 02/07), covid 19 convalescent plasma 02/07.  RR: 18  Pulse  oxymetry: 94%  Fi02: 21 % room air.   Patient has completed medical therapy for COVID pneumonia. Pending home 02 arrangements before he can be discharged home.   Patient continue to require supplemental 02 for ambulation, oxygen desaturation down to 86%.   Continue with airway clearing techniques, flutter valve and incentive spirometer, as needed antitussive agents and bronchodilators.    Out of bed to chair. Pending home 02 arrangements.   2. Uncontrolled T2DM (Hgb A1c 11,9) with steroid induced hyperglycemia. Capillary glucose this am 210 fasting, will increase metformin to bid, for now and will continue sliding scale for glucose cover and monitoring while hospitalized.  3. HTN.Patient with reactive tachycardia, resumed metoprolol with good toleration.   4. New hyperkalemia. K corrected to 4,4 with preserved renal function, serum cr at 1,11.   DVT prophylaxis:enoxaparin Code Status:full Family Communication:no family at the bedside Disposition Plan/ discharge barriers:dc home when home 02 arranged.     Subjective: Patient is feeling well, dyspnea continue to improve, but still not yet back to baseline, no nausea or vomiting, no chest pain, continue to need supplemental 02 on ambulation.   Objective: Vitals:   06/23/19 2214 06/23/19 2240 06/24/19 0307 06/24/19 0815  BP:   118/82 110/78  Pulse: (!) 102 98 91 91  Resp:   18   Temp:   98.4 F (36.9 C)   TempSrc:   Oral   SpO2: 99% 97% 94%   Weight:      Height:        Intake/Output Summary (Last 24 hours) at 06/24/2019 0930 Last data filed at 06/24/2019 0600 Gross per 24 hour  Intake 1180 ml  Output --  Net 1180 ml   Filed Weights   06/14/19 1801 06/15/19 0124  Weight: 80.7 kg 79.9 kg  Examination:   General: Not in pain or dyspnea.,  Neurology: Awake and alert, non focal  E ENT: no pallor, no icterus, oral mucosa moist Cardiovascular: No JVD. S1-S2 present, rhythmic, no gallops, rubs, or  murmurs. No lower extremity edema. Pulmonary: positive breath sounds bilaterally. Gastrointestinal. Abdomen with no organomegaly, non tender, no rebound or guarding Skin. No rashes Musculoskeletal: no joint deformities     Data Reviewed: I have personally reviewed following labs and imaging studies  CBC: Recent Labs  Lab 06/18/19 0405 06/19/19 0330  WBC 8.1 7.4  NEUTROABS 4.9 4.4  HGB 15.1 15.2  HCT 47.3 47.6  MCV 89.9 89.6  PLT 363 735   Basic Metabolic Panel: Recent Labs  Lab 06/20/19 0316 06/20/19 0316 06/21/19 0107 06/22/19 0150 06/22/19 1535 06/22/19 2135 06/23/19 0950  NA 136  --  134* 135  --  131* 130*  K 4.8   < > 5.1 5.4* 5.8* 5.0 4.4  CL 96*  --  96* 95*  --  91* 89*  CO2 30  --  29 29  --  28 31  GLUCOSE 169*  --  175* 180*  --  364* 230*  BUN 23  --  29* 27*  --  30* 25*  CREATININE 1.08  --  1.17 1.06  --  1.14 1.11  CALCIUM 9.0  --  9.3 9.4  --  9.2 8.6*   < > = values in this interval not displayed.   GFR: Estimated Creatinine Clearance: 67.6 mL/min (by C-G formula based on SCr of 1.11 mg/dL). Liver Function Tests: Recent Labs  Lab 06/18/19 0405 06/19/19 0330 06/20/19 0316 06/21/19 0107 06/22/19 0150  AST 61* 49* 34 47* 56*  ALT 69* 61* 59* 72* 89*  ALKPHOS 82 81 79 95 94  BILITOT 0.6 0.9 0.8 0.8 0.7  PROT 6.8 6.3* 6.5 6.9 6.8  ALBUMIN 2.6* 2.6* 2.8* 3.0* 3.0*   No results for input(s): LIPASE, AMYLASE in the last 168 hours. No results for input(s): AMMONIA in the last 168 hours. Coagulation Profile: No results for input(s): INR, PROTIME in the last 168 hours. Cardiac Enzymes: No results for input(s): CKTOTAL, CKMB, CKMBINDEX, TROPONINI in the last 168 hours. BNP (last 3 results) No results for input(s): PROBNP in the last 8760 hours. HbA1C: No results for input(s): HGBA1C in the last 72 hours. CBG: Recent Labs  Lab 06/23/19 0711 06/23/19 1409 06/23/19 1551 06/23/19 2120 06/24/19 0750  GLUCAP 207* 442* 364* 289* 210*    Lipid Profile: No results for input(s): CHOL, HDL, LDLCALC, TRIG, CHOLHDL, LDLDIRECT in the last 72 hours. Thyroid Function Tests: No results for input(s): TSH, T4TOTAL, FREET4, T3FREE, THYROIDAB in the last 72 hours. Anemia Panel: Recent Labs    06/22/19 0150  FERRITIN 1,399*      Radiology Studies: I have reviewed all of the imaging during this hospital visit personally     Scheduled Meds: . vitamin C  500 mg Oral Daily  . chlorpheniramine-HYDROcodone  5 mL Oral Q12H  . enoxaparin (LOVENOX) injection  40 mg Subcutaneous BID  . fluticasone  2 spray Each Nare BID  . insulin aspart  0-9 Units Subcutaneous TID WC  . Ipratropium-Albuterol  1 puff Inhalation Q6H  . metFORMIN  500 mg Oral BID WC  . metoprolol tartrate  25 mg Oral BID  . senna-docusate  2 tablet Oral BID  . sodium chloride flush  3 mL Intravenous Q12H  . sodium zirconium cyclosilicate  10 g Oral TID  .  zinc sulfate  220 mg Oral Daily   Continuous Infusions: . sodium chloride       LOS: 10 days        Rayden Dock Annett Gula, MD

## 2019-06-24 NOTE — Progress Notes (Signed)
[facility]re Glucose point of care = 186

## 2019-06-25 LAB — BASIC METABOLIC PANEL
Anion gap: 10 (ref 5–15)
BUN: 24 mg/dL — ABNORMAL HIGH (ref 8–23)
CO2: 31 mmol/L (ref 22–32)
Calcium: 8.9 mg/dL (ref 8.9–10.3)
Chloride: 93 mmol/L — ABNORMAL LOW (ref 98–111)
Creatinine, Ser: 1.09 mg/dL (ref 0.61–1.24)
GFR calc Af Amer: >60 mL/min (ref >60)
GFR calc non Af Amer: >60 mL/min (ref >60)
Glucose, Bld: 208 mg/dL — ABNORMAL HIGH (ref 70–99)
Potassium: 4.3 mmol/L (ref 3.5–5.1)
Sodium: 134 mmol/L — ABNORMAL LOW (ref 135–145)

## 2019-06-25 LAB — GLUCOSE, CAPILLARY
Glucose-Capillary: 144 mg/dL — ABNORMAL HIGH (ref 70–99)
Glucose-Capillary: 175 mg/dL — ABNORMAL HIGH (ref 70–99)
Glucose-Capillary: 217 mg/dL — ABNORMAL HIGH (ref 70–99)

## 2019-06-25 MED ORDER — METFORMIN HCL 500 MG PO TABS: 500.0000 mg | ORAL_TABLET | Freq: Two times a day (BID) | ORAL | 0 refills | Status: AC

## 2019-06-25 MED ORDER — METOPROLOL TARTRATE 25 MG PO TABS
25.0000 mg | ORAL_TABLET | Freq: Two times a day (BID) | ORAL | Status: DC
  Administered 2019-06-25: 25 mg via ORAL
  Filled 2019-06-25: qty 1

## 2019-06-25 NOTE — Discharge Instructions (Signed)
Person Under Monitoring Name: Joseph Todd  Location: [address] [place] [address]   Infection Prevention Recommendations for Individuals Confirmed to have, or Being Evaluated for, 2019 Novel Coronavirus (COVID-19) Infection Who Receive Care at Home  Individuals who are confirmed to have, or are being evaluated for, COVID-19 should follow the prevention steps below until a healthcare provider or local or state health department says they can return to normal activities.  Stay home except to get medical care You should restrict activities outside your home, except for getting medical care. Do not go to work, school, or public areas, and do not use public transportation or taxis.  Call ahead before visiting your doctor Before your medical appointment, call the healthcare provider and tell them that you have, or are being evaluated for, COVID-19 infection. This will help the healthcare provider's office take steps to keep other people from getting infected. Ask your healthcare provider to call the local or state health department.  Monitor your symptoms Seek prompt medical attention if your illness is worsening (e.g., difficulty breathing). Before going to your medical appointment, call the healthcare provider and tell them that you have, or are being evaluated for, COVID-19 infection. Ask your healthcare provider to call the local or state health department.  Wear a facemask You should wear a facemask that covers your nose and mouth when you are in the same room with other people and when you visit a healthcare provider. People who live with or visit you should also wear a facemask while they are in the same room with you.  Separate yourself from other people in your home As much as possible, you should stay in a different room from other people in your home. Also, you should use a separate bathroom, if available.  Avoid sharing household items You should not  share dishes, drinking glasses, cups, eating utensils, towels, bedding, or other items with other people in your home. After using these items, you should wash them thoroughly with soap and water.  Cover your coughs and sneezes Cover your mouth and nose with a tissue when you cough or sneeze, or you can cough or sneeze into your sleeve. Throw used tissues in a lined trash can, and immediately wash your hands with soap and water for at least 20 seconds or use an alcohol-based hand rub.  Wash your [employer] your hands often and thoroughly with soap and water for at least 20 seconds. You can use an alcohol-based hand sanitizer if soap and water are not available and if your hands are not visibly dirty. Avoid touching your eyes, nose, and mouth with unwashed hands.   Prevention Steps for Caregivers and Household Members of Individuals Confirmed to have, or Being Evaluated for, COVID-19 Infection Being Cared for in the Home  If you live with, or provide care at home for, a person confirmed to have, or being evaluated for, COVID-19 infection please follow these guidelines to prevent infection:  Follow healthcare provider's instructions Make sure that you understand and can help the patient follow any healthcare provider instructions for all care.  Provide for the patient's basic needs You should help the patient with basic needs in the home and provide support for getting groceries, prescriptions, and other personal needs.  Monitor the patient's symptoms If they are getting sicker, call his or her medical provider and tell them that the patient has, or is being evaluated for, COVID-19 infection. This will help the healthcare provider's office  take steps to keep other people from getting infected. Ask the healthcare provider to call the local or state health department.  Limit the number of people who have contact with the patient  If possible, have only one caregiver for the  patient.  Other household members should stay in another home or place of residence. If this is not possible, they should stay  in another room, or be separated from the patient as much as possible. Use a separate bathroom, if available.  Restrict visitors who do not have an essential need to be in the home.  Keep older adults, very young children, and other sick people away from the patient Keep older adults, very young children, and those who have compromised immune systems or chronic health conditions away from the patient. This includes people with chronic heart, lung, or kidney conditions, diabetes, and cancer.  Ensure good ventilation Make sure that shared spaces in the home have good air flow, such as from an air conditioner or an opened window, weather permitting.  Wash your hands often  Wash your hands often and thoroughly with soap and water for at least 20 seconds. You can use an alcohol based hand sanitizer if soap and water are not available and if your hands are not visibly dirty.  Avoid touching your eyes, nose, and mouth with unwashed hands.  Use disposable paper towels to dry your hands. If not available, use dedicated cloth towels and replace them when they become wet.  Wear a facemask and gloves  Wear a disposable facemask at all times in the room and gloves when you touch or have contact with the patient's blood, body fluids, and/or secretions or excretions, such as sweat, saliva, sputum, nasal mucus, vomit, urine, or feces.  Ensure the mask fits over your nose and mouth tightly, and do not touch it during use.  Throw out disposable facemasks and gloves after using them. Do not reuse.  Wash your hands immediately after removing your facemask and gloves.  If your personal clothing becomes contaminated, carefully remove clothing and launder. Wash your hands after handling contaminated clothing.  Place all used disposable facemasks, gloves, and other waste in a lined  container before disposing them with other household waste.  Remove gloves and wash your hands immediately after handling these items.  Do not share dishes, glasses, or other household items with the patient  Avoid sharing household items. You should not share dishes, drinking glasses, cups, eating utensils, towels, bedding, or other items with a patient who is confirmed to have, or being evaluated for, COVID-19 infection.  After the person uses these items, you should wash them thoroughly with soap and water.  Wash laundry thoroughly  Immediately remove and wash clothes or bedding that have blood, body fluids, and/or secretions or excretions, such as sweat, saliva, sputum, nasal mucus, vomit, urine, or feces, on them.  Wear gloves when handling laundry from the patient.  Read and follow directions on labels of laundry or clothing items and detergent. In general, wash and dry with the warmest temperatures recommended on the label.  Clean all areas the individual has used often  Clean all touchable surfaces, such as counters, tabletops, doorknobs, bathroom fixtures, toilets, phones, keyboards, tablets, and bedside tables, every day. Also, clean any surfaces that may have blood, body fluids, and/or secretions or excretions on them.  Wear gloves when cleaning surfaces the patient has come in contact with.  Use a diluted bleach solution (e.g., dilute bleach with 1 part  bleach and 10 parts water) or a household disinfectant with a label that says EPA-registered for coronaviruses. To make a bleach solution at home, add 1 tablespoon of bleach to 1 quart (4 cups) of water. For a larger supply, add  cup of bleach to 1 gallon (16 cups) of water.  Read labels of cleaning products and follow recommendations provided on product labels. Labels contain instructions for safe and effective use of the cleaning product including precautions you should take when applying the product, such as wearing gloves or  eye protection and making sure you have good ventilation during use of the product.  Remove gloves and wash hands immediately after cleaning.  Monitor yourself for signs and symptoms of illness Caregivers and household members are considered close contacts, should monitor their health, and will be asked to limit movement outside of the home to the extent possible. Follow the monitoring steps for close contacts listed on the symptom monitoring form.   ? If you have additional questions, contact your local health department or call the epidemiologist on call at [phone] (available 24/7). ? This guidance is subject to change. For the most up-to-date guidance from [facility], please refer to their website: [email or web address].pl

## 2019-06-25 NOTE — Progress Notes (Signed)
SATURATION QUALIFICATIONS: (This note is used to comply with regulatory documentation for home oxygen)  Patient Saturations on Room Air at Rest = 97%  Patient Saturations on Room Air while Ambulating = min 85%  Patient Saturations on 3 Liters of oxygen while Ambulating = min 91%  Please briefly explain why patient needs home oxygen: Pt desaturates on room air with mobility and requires supplemental oxygen to maintain a safe saturation level.   Drema Pry, PT

## 2019-06-25 NOTE — Progress Notes (Signed)
   06/25/19 1640 06/25/19 1746  Family/Significant Other Communication  Family/Significant Other Update Called;Updated (Wife Doris) Other (Comment) ([facility] for transport)  Spoke with wife Doris. Updated on transportation home

## 2019-06-25 NOTE — Progress Notes (Signed)
S: Patient is feeling well today, he has been out of bed with help of physical therapy, his mobility has improved, his dyspnea continues to improve as well.  O: Temperature 97.9, blood pressure 113/67, heart rate 100, respiratory rate 20, oxygen saturation 96% on room air at rest. His lungs are clear to auscultation bilaterally, heart S1-S2, present rhythmic, soft abdomen, no lower extremity edema  A: Recovering acute hypoxic respiratory failure due to SARS COVID-19 viral pneumonia.  P: Patient's home oxygen has been approved, patient will be discharged home, no need for further prophylactic enoxaparin as an outpatient since patient has recovered his mobility.  Follow-up with home health services.

## 2019-06-25 NOTE — Progress Notes (Signed)
   06/25/19 1227  Family/Significant Other Communication  Family/Significant Other Update Other (Comment) (Patient deferred)

## 2019-06-25 NOTE — Progress Notes (Signed)
[facility] here to pick up patient. Discharge paperwork reviewed with patient and health department formed filled out and placed in chart. IV removed. Pt is AOV x4 and without any questions or complaints. Pt has all valuables with him.

## 2019-06-25 NOTE — TOC Transition Note (Signed)
Transition of Care [facility]) - CM/SW Discharge Note   Patient Details  Name: Joseph Todd MRN: [id number] Date of Birth: January 08, 1956  Transition of Care [facility]) CM/SW Contact:  Ross Ludwig, LCSW Phone Number: 06/25/2019, 4:29 PM   Clinical Narrative:     10:45am CSW paged [place] social worker Marquita to find out if the respiratory department received orders at the [facility].  11:30am [place] worker called CSW back and she stated the [place] did not receive any of the documentation last week and and she was going to contact patient's physicians.  1:15pm CSW received phone call back from [place] who said the respiratory department at [facility] and [place] has not received all the requested paperwork from Friday and it needs to be refaxed again.  1:45pm  Jessica from [place] respiratory stated they have received the consult from physician for home oxygen, however they have not received any of the qualification or the oxygen orders from the hospital.  Janett Billow reported to [place] that the fax number that everything was sent on Friday does not work and to refax oxygen orders and qualifications to [phone], once they receive the documentation, then they can arrange for oxygen to be ordered.  CSW refaxed required documentation through Epic EFAX and also asked TOC assistant to hard fax required documentation.  2:00pm  CSW received phone call from [place], that confirmation that fax has gone through has been received.  3:30pm  CSW received phone call from [facility] that the documentation has been received.  3:45pm  CSW received phone call from [place] at the [place] department that they have received all the required paperwork and they have ordered the oxygen from [place] which is the contracted oxygen company.  She said the oxygen usually has a 4 hour window of when it can be delivered and they will call patient's wife to set up time for oxygen to be  delivered.  4:00pm CSW called patient's wife Tamela Oddi [phone] and told her that the oxygen company will be calling her to let her know when they can deliver it.  CSW asked her to call nurse's station at [phone] once oxygen has been delivered and then bedside nurse can call EMS to transport patient home.  Patient's wife stated she will call once oxygen has been delivered.  CSW updated bedside nurse to call EMS once she hears from patient's wife, CSW updated physician.  Patient will be going home with home health through [facility].  CSW signing off please reconsult with any other social work needs, home health agency has been notified of planned discharge.   Final next level of care: [place] Barriers to Discharge: Barriers Resolved   Patient Goals and CMS Choice Patient states their goals for this hospitalization and ongoing recovery are:: To return back home with home health. CMS Medicare.gov Compare Post Acute Care list provided to:: Patient Represenative (must comment) Choice offered to / list presented to : Spouse  Discharge Placement  Patient will be discharging home with home health and oxygen.              Patient to be transferred to facility by: [place] Name of family member notified: Tamela Oddi patient's wife, [phone] Patient and family notified of of transfer: 06/21/19  Discharge Plan and Services                DME Arranged: Oxygen, 3-N-1 DME Agency: [place] Date DME Agency Contacted: 06/25/19 Time  DME Agency Contacted: [phone] Representative spoke with at DME Agency: Shanda Bumps HH Arranged: PT, OT, RN HH Agency: [facility] Date [facility] Agency Contacted: 06/21/19 Time HH Agency Contacted: 1430 Representative spoke with at [facility] Agency: Amy  Social Determinants of Health (SDOH) Interventions     Readmission Risk Interventions No flowsheet data found.

## 2019-06-25 NOTE — Progress Notes (Signed)
Occupational Therapy Treatment Patient Details Name: Joseph Todd No MRN: [id number] DOB: 21-Aug-1955 Today's Date: 06/25/2019    History of present illness Joseph Todd is a 64 year old African-American male with past medical history remarkable for type 2 diabetes mellitus who presented to the ED with 1 week history of weakness, shortness of breath and nausea.  Patient reports dyspnea has been progressive with subjective fevers.  Denies vomiting/diarrhea, no chest pain, no abdominal pain. Recently hospitalized a week ago with syncope, Covid-19 PCR was negative at that time. In the ED, patient was noted to be hypoxic requiring nonrebreather to maintain adequate oxygenation.  ED PA referred for admission for Covid pneumonia with hypoxia.   OT comments  Patient up in chair on arrival and agreeable to therapy.  He stated he has been working hard on his breathing exercises and walking around room.  Walked 100 ft with supervision and no equipment.  SpO2 remained >92 on room air throughout session.  Patient stated he was feeling good but weaker than normal still.  Provided him with UE theraband exercises and practiced them, and demonstrated standing exercises.  Completed flutter valve and incentive spirometer exercises (pulled 750).  Will continue to follow with OT acutely to address the deficits listed below.    Follow Up Recommendations  No OT follow up;Supervision - Intermittent    Equipment Recommendations  3 in 1 bedside commode    Recommendations for Other Services      Precautions / Restrictions Precautions Precautions: Other (comment) Precaution Comments: Monitor SpO2 Restrictions Weight Bearing Restrictions: No       Mobility Bed Mobility               General bed mobility comments: Up in chair  Transfers Overall transfer level: Needs assistance Equipment used: None Transfers: Sit to/from Stand Sit to Stand: Supervision              Balance Overall balance  assessment: Mild deficits observed, not formally tested                                         ADL either performed or assessed with clinical judgement   ADL Overall ADL's : Needs assistance/impaired                                     Functional mobility during ADLs: Supervision/safety       Vision       Perception     Praxis      Cognition Arousal/Alertness: Awake/alert Behavior During Therapy: WFL for tasks assessed/performed Overall Cognitive Status: Within Functional Limits for tasks assessed                                 General Comments: Very motivated and agreeable to participate in therapy        Exercises Exercises: Other exercises;General Upper Extremity General Exercises - Upper Extremity Shoulder ABduction: AROM;Strengthening;Both;10 reps;Theraband Theraband Level (Shoulder Abduction): Level 2 (Red) Shoulder Horizontal ABduction: AROM;Strengthening;Both;10 reps;Theraband Theraband Level (Shoulder Horizontal Abduction): Level 2 (Red) Elbow Flexion: AROM;Both;10 reps;Theraband;Strengthening Theraband Level (Elbow Flexion): Level 2 (Red) Elbow Extension: AROM;Both;Strengthening;10 reps;Theraband Theraband Level (Elbow Extension): Level 2 (Red) Other Exercises Other Exercises: 10 reps flutter valve Other Exercises: 10 reps IS   Shoulder Instructions  General Comments Reviewed standing exercies with him, as well as reviewed/practiced UE theraband exerciises.    Pertinent Vitals/ Pain       Pain Assessment: No/denies pain  Home Living                                          Prior Functioning/Environment              Frequency  Min 3X/week        Progress Toward Goals  OT Goals(current goals can now be found in the care plan section)  Progress towards OT goals: Progressing toward goals  Acute Rehab OT Goals Patient Stated Goal: to go home Time For Goal  Achievement: 06/30/19 Potential to Achieve Goals: Good  Plan Discharge plan remains appropriate    Co-evaluation                 AM-PAC OT "6 Clicks" Daily Activity     Outcome Measure   Help from another person eating meals?: None Help from another person taking care of personal grooming?: A Little Help from another person toileting, which includes using toliet, bedpan, or urinal?: None Help from another person bathing (including washing, rinsing, drying)?: A Little Help from another person to put on and taking off regular upper body clothing?: A Little Help from another person to put on and taking off regular lower body clothing?: A Little 6 Click Score: 20    End of Session    OT Visit Diagnosis: Unsteadiness on feet (R26.81);Muscle weakness (generalized) (M62.81)   Activity Tolerance Patient tolerated treatment well   Patient Left in chair;with call bell/phone within reach   Nurse Communication Mobility status        Time: 1937-9024 OT Time Calculation (min): 24 min  Charges: OT General Charges $OT Visit: 1 Visit OT Treatments $Therapeutic Activity: 23-37 mins  Barbie Banner, OTR/L    Adella Hare 06/25/2019, 2:32 PM

## 2019-07-23 ENCOUNTER — Other Ambulatory Visit: Payer: Self-pay

## 2019-11-04 ENCOUNTER — Other Ambulatory Visit: Payer: Self-pay

## 2020-08-26 ENCOUNTER — Other Ambulatory Visit: Payer: Self-pay

## 2020-08-26 ENCOUNTER — Encounter: Payer: Self-pay | Admitting: Internal Medicine

## 2020-08-26 ENCOUNTER — Ambulatory Visit (INDEPENDENT_AMBULATORY_CARE_PROVIDER_SITE_OTHER): Payer: No Typology Code available for payment source | Admitting: Internal Medicine

## 2020-08-26 ENCOUNTER — Institutional Professional Consult (permissible substitution): Payer: No Typology Code available for payment source | Admitting: Internal Medicine

## 2020-08-26 VITALS — BP 130/78 | HR 103 | Temp 97.7°F | Ht 68.0 in | Wt 186.6 lb

## 2020-08-26 DIAGNOSIS — J8410 Pulmonary fibrosis, unspecified: Secondary | ICD-10-CM

## 2020-08-26 DIAGNOSIS — U071 COVID-19: Secondary | ICD-10-CM

## 2020-08-26 NOTE — Patient Instructions (Addendum)
The patient should have follow up scheduled with myself in 1 months.   Prior to next visit patient should have: CT Chest  Please call our office to make sure we have received records from the [place]. Specifically I want your breathing testing.

## 2020-08-26 NOTE — Progress Notes (Signed)
Olaoluwa Grieder    [id number]    [birth year]  Primary Care Physician:Patient, No Pcp Per (Inactive)  Referring Physician: [facility] [address] [place],  [place] [address] Reason for Consultation: "stiff lung" Date of Consultation: 08/26/2020  Chief complaint:   Chief Complaint  Patient presents with  . Consult    Shortness of breath. Mild congestion of nose.     HPI: Ramesh Moan is a 65 y.o. gentleman who presents for new patient evaluation for pulmonary fibrosis.  He was diagnosed with covid 19 infection in April 2021.  He was hospitalized here locally in [place] was treated with monoclonal antibody and steroids.  He was discharged on home oxygen 2 L.  This has now been weaned off.  Given his persistent dyspnea he did some pulmonary function testing and was told he had "stiff lung" after this. It was going to be a while before he could see a specialist at the [place] and he was referred here for further evaluation.   He notes dyspnea with exertion although says he can walk 5 city blocks without needing to stop to rest.  Denies any chest tightness wheezing or ongoing coughing.  He is mostly functional with his ADLs but just feels profoundly fatigued.  He has been prescribed inhalers but is not sure if they are actually helping him.  Had pneumonia once 10 years ago, was never hospitalized prior to covid.  There is no family history of lung disease.  His hobbies include working on classic cars and playing bass guitar.  He has not had any known asbestos exposure.  He does not have any pets.  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 Exposures: lives at home with wife Smoking history: 1/2 ppd.   Social History   Occupational History  . Not on file  Tobacco Use  . Smoking status: Former Smoker    Packs/day: 0.25    Quit date: 1990    Years  since quitting: 32.3  . Smokeless tobacco: Never Used  . Tobacco comment: at the most 4 a day.  Substance and Sexual Activity  . Alcohol use: Yes    Alcohol/week: 3.0 standard drinks    Types: 3 Cans of beer per week  . Drug use: Never  . Sexual activity: Not on file    Relevant family history:  Family History  Problem Relation Age of Onset  . Heart disease Father   . Hypertension Father   . Diabetes Brother   . Lung disease Neg Hx     Past Medical History:  Diagnosis Date  . Carpal tunnel syndrome on right   . DM (diabetes mellitus) (HCC)   . EKG abnormalities   . Neck pain   . Syncope and collapse     History reviewed. No pertinent surgical history.   Physical Exam: Blood pressure 130/78, pulse (!) 103, temperature 97.7 F (36.5 C), temperature source Temporal, height 5\' 8"  (1.727 m), weight 186 lb 9.6 oz (84.6 kg), SpO2 97 %. Gen:      No acute distress ENT:  no nasal polyps, mucus membranes moist Lungs:    No increased respiratory effort, symmetric chest wall excursion, clear to auscultation bilaterally, no wheezes or crackles CV:         Regular rate and rhythm; no murmurs, rubs, or  gallops.  No pedal edema Abd:      + bowel sounds; soft, non-tender; no distension MSK: no acute synovitis of DIP or PIP joints, no mechanics hands.  Skin:      Warm and dry; mild acanthosis nigricans Neuro: normal speech, no focal facial asymmetry Psych: alert and oriented x3, normal mood and affect   Data Reviewed/Medical Decision Making:  Independent interpretation of tests: Imaging: . Review of patient's CT angio chest from February 2021 images revealed bilateral multifocal airspace opacities especially the periphery which are consistent with COVID-19 infection.  There is very mild emphysema noted in the apices. The patient's images have been independently reviewed by me.    PFTs: Not on file, recently done at the [place]  Labs:  Lab Results  Component Value Date   WBC 7.4  06/19/2019   HGB 15.2 06/19/2019   HCT 47.6 06/19/2019   MCV 89.6 06/19/2019   PLT 273 06/19/2019   Lab Results  Component Value Date   NA 134 (L) 06/25/2019   K 4.3 06/25/2019   CL 93 (L) 06/25/2019   CO2 31 06/25/2019     Immunization status:  Immunization History  Administered Date(s) Administered  . Influenza Split 01/13/2016, 02/06/2017  . Influenza,inj,Quad PF,6+ Mos 08/14/2020  . Influenza-Unspecified 06/05/2013  . Moderna Sars-Covid-2 Vaccination 09/18/2019, 10/08/2019, 04/23/2020  . Pneumococcal-Unspecified 05/09/2005  . Tdap 11/17/2012    . I reviewed prior external note(s) from hospital stay . I reviewed the result(s) of the labs and imaging as noted above.  . I have ordered CT Chest  Assessment:  COVID-19 infection 2021 Pulmonary fibrosis Ongoing shortness of breath  Plan/Recommendations: Mr. Freida Busman has ongoing shortness of breath after being diagnosed COVID-19 infection in 2021.  He has been weaned of oxygen and has improved traumatically over the last year but still has ongoing symptoms of dyspnea and fatigue.  Inhalers do not seem to be helping him.  I think he can probably stop taking those.  His CT chest showed pretty impressive multifocal airspace opacities.  However his exam is reassuring and that his oxygen saturations are 97% on room air and he has no identifiable crackles on exam.  I will proceed with obtaining a CT chest to evaluate further.  He is already had PFTs done at the [place].  We will obtain those so that he does not repeat testing today.  I will see him back after this.   Return to Care: Return in about 4 weeks (around 09/23/2020).  Durel Salts, MD Pulmonary and Critical Care Medicine [facility] [phone]  CC: [facility]

## 2020-08-28 ENCOUNTER — Telehealth: Payer: Self-pay | Admitting: Internal Medicine

## 2020-08-28 NOTE — Telephone Encounter (Signed)
Pt originally had appt scheduled with MR but that appt was cancelled and the appt was then rescheduled for pt to see ND. Pt had consult appt 4/20 with Dr. Celine Mans.  Will place the paperwork in Dr. Humphrey Rolls file for her to be able to review. Nothing further needed.

## 2020-08-28 NOTE — Telephone Encounter (Signed)
Irving Burton  The patient's [facility] stack on my desk with a pen writing sayin 10.30am 08/26/20 with Ples Trudel. Same time (think it was him Lucilla Edin) dropped off my list. Chart now says seen by Celine Mans  Plan  - pls get the [facility] stack from my side desk and  Give to Dr Celine Mans  Thanks  MR

## 2020-09-07 ENCOUNTER — Ambulatory Visit
Admission: RE | Admit: 2020-09-07 | Discharge: 2020-09-07 | Disposition: A | Discharge status: Home / Self Care | Payer: Self-pay | Source: Ambulatory Visit | Attending: Internal Medicine | Admitting: Internal Medicine

## 2020-09-07 ENCOUNTER — Other Ambulatory Visit: Payer: Self-pay

## 2020-09-07 DIAGNOSIS — J8410 Pulmonary fibrosis, unspecified: Secondary | ICD-10-CM

## 2020-09-07 NOTE — Progress Notes (Signed)
Disc with CT sent to [facility] to be loaded into system.

## 2020-09-08 ENCOUNTER — Ambulatory Visit (INDEPENDENT_AMBULATORY_CARE_PROVIDER_SITE_OTHER)
Admission: RE | Admit: 2020-09-08 | Discharge: 2020-09-08 | Disposition: A | Discharge status: Home / Self Care | Payer: No Typology Code available for payment source | Source: Ambulatory Visit | Attending: Internal Medicine | Admitting: Internal Medicine

## 2020-09-08 ENCOUNTER — Other Ambulatory Visit: Payer: Self-pay

## 2020-09-08 DIAGNOSIS — J8410 Pulmonary fibrosis, unspecified: Secondary | ICD-10-CM | POA: Diagnosis not present

## 2020-09-08 DIAGNOSIS — U071 COVID-19: Secondary | ICD-10-CM | POA: Diagnosis not present

## 2020-09-18 ENCOUNTER — Other Ambulatory Visit: Payer: Self-pay

## 2020-09-18 ENCOUNTER — Encounter: Payer: Self-pay | Admitting: Internal Medicine

## 2020-09-18 ENCOUNTER — Ambulatory Visit (INDEPENDENT_AMBULATORY_CARE_PROVIDER_SITE_OTHER): Payer: No Typology Code available for payment source | Admitting: Internal Medicine

## 2020-09-18 VITALS — BP 120/70 | HR 93 | Temp 97.9°F | Ht 68.0 in | Wt 187.0 lb

## 2020-09-18 DIAGNOSIS — U071 COVID-19: Secondary | ICD-10-CM

## 2020-09-18 DIAGNOSIS — J8410 Pulmonary fibrosis, unspecified: Secondary | ICD-10-CM

## 2020-09-18 DIAGNOSIS — J438 Other emphysema: Secondary | ICD-10-CM

## 2020-09-18 NOTE — Progress Notes (Signed)
Joseph Todd    [id number]    [birth year]  Primary Care Physician:Kulik, Maryruth Hancock, MD Date of Appointment: 09/18/2020 Established Patient Visit  Chief complaint:   Chief Complaint  Patient presents with  . Follow-up    Pt states he is about the same since last visit. States he mainly becomes SOB with exertion but states there have also been times when he is lying in bed and he will become SOB when rolling over. Denies any complaints of cough.     HPI: Joseph Todd is a 65 y.o. gentleman with COVID 19 infection 2021 with resultant pulmonary fibrosis.  Interval Updates: Here for follow up today after CT scan. No chest pain. Has dyspnea with exertion. Still working two jobs in Consulting civil engineer. Keeping up with ADLs. Albuterol inhaler doesn't seen to  Be helping much.   I have reviewed the patient's family social and past medical history and updated as appropriate.   Past Medical History:  Diagnosis Date  . Carpal tunnel syndrome on right   . DM (diabetes mellitus) (HCC)   . EKG abnormalities   . Neck pain   . Syncope and collapse     History reviewed. No pertinent surgical history.  Family History  Problem Relation Age of Onset  . Heart disease Father   . Hypertension Father   . Diabetes Brother   . Lung disease Neg Hx     Social History   Occupational History  . Not on file  Tobacco Use  . Smoking status: Former Smoker    Packs/day: 0.25    Quit date: 1990    Years since quitting: 32.3  . Smokeless tobacco: Never Used  . Tobacco comment: at the most 4 a day.  Substance and Sexual Activity  . Alcohol use: Yes    Alcohol/week: 3.0 standard drinks    Types: 3 Cans of beer per week  . Drug use: Never  . Sexual activity: Not on file     Physical Exam: Blood pressure 120/70, pulse 93, temperature 97.9 F (36.6 C), temperature source Temporal, height 5\' 8"  (1.727 m), weight 187 lb (84.8 kg), SpO2 99 %.  Gen:      No acute distress Lungs:    No increased  respiratory effort, symmetric chest wall excursion, clear to auscultation bilaterally, no wheezes or crackles CV:         Regular rate and rhythm; no murmurs, rubs, or gallops.  No pedal edema   Data Reviewed: Imaging: I have personally reviewed the CT Chest from May 2022 and compared it to Feb 2022. There is dramatic improvement in multifocal airspace opacities. The CT Chest in May has some fine fibrotic changes in both bases as well as some paraseptal emphysema  PFTs: No flowsheet data found.   Labs:  Immunization status: Immunization History  Administered Date(s) Administered  . Influenza Split 01/13/2016, 02/06/2017  . Influenza,inj,Quad PF,6+ Mos 08/14/2020  . Influenza-Unspecified 06/05/2013  . Moderna Sars-Covid-2 Vaccination 09/18/2019, 10/08/2019, 04/23/2020  . Pneumococcal-Unspecified 05/09/2005  . Tdap 11/17/2012    Assessment:  COVID 19 infection Jan 2022 Pulmonary Fibrosis Mild Emphysema History of tobacco use  Plan/Recommendations: We discussed that there are limited treatment options for mild pulmonary fibrosis. We did discuss pulmonary rehab therapy and he has other health issues he would like to take care of first. Continue to abstain from tobacco use. We will get a full set of PFTs at some point before his next visit.  I  will see him back sooner if he has any concerns.   Return to Care: Return in about 6 months (around 03/21/2021).   Durel Salts, MD Pulmonary and Critical Care Medicine [facility] Office:[phone]

## 2020-09-18 NOTE — Patient Instructions (Signed)
The patient should have follow up scheduled with myself in 6 months.   Prior to next visit patient should have: Full set of PFTs - when able, one hour.

## 2020-09-28 ENCOUNTER — Other Ambulatory Visit (HOSPITAL_COMMUNITY): Payer: No Typology Code available for payment source

## 2020-10-06 ENCOUNTER — Other Ambulatory Visit (HOSPITAL_COMMUNITY)
Admission: RE | Admit: 2020-10-06 | Discharge: 2020-10-06 | Disposition: A | Discharge status: Home / Self Care | Payer: No Typology Code available for payment source | Source: Ambulatory Visit | Attending: Internal Medicine | Admitting: Internal Medicine

## 2020-10-06 DIAGNOSIS — Z20822 Contact with and (suspected) exposure to covid-19: Secondary | ICD-10-CM | POA: Insufficient documentation

## 2020-10-06 DIAGNOSIS — Z01812 Encounter for preprocedural laboratory examination: Secondary | ICD-10-CM | POA: Insufficient documentation

## 2020-10-06 LAB — SARS CORONAVIRUS 2 (TAT 6-24 HRS): SARS Coronavirus 2: NEGATIVE

## 2020-10-07 ENCOUNTER — Telehealth: Payer: Self-pay | Admitting: *Deleted

## 2020-10-07 NOTE — Telephone Encounter (Signed)
Called and left vm regarding CD.  Left detailed message per DPR that CD is ready for pick up, he has an appointment in the office on 10/08/20 and can pick it up then.

## 2020-10-08 ENCOUNTER — Ambulatory Visit (INDEPENDENT_AMBULATORY_CARE_PROVIDER_SITE_OTHER): Payer: No Typology Code available for payment source | Admitting: Internal Medicine

## 2020-10-08 ENCOUNTER — Other Ambulatory Visit: Payer: Self-pay

## 2020-10-08 DIAGNOSIS — J8410 Pulmonary fibrosis, unspecified: Secondary | ICD-10-CM

## 2020-10-08 DIAGNOSIS — J438 Other emphysema: Secondary | ICD-10-CM

## 2020-10-08 LAB — PULMONARY FUNCTION TEST
DL/VA % pred: 128 %
DL/VA: 5.42 ml/min/mmHg/L
DLCO cor % pred: 72 %
DLCO cor: 18.5 ml/min/mmHg
DLCO unc % pred: 72 %
DLCO unc: 18.5 ml/min/mmHg
FEF 25-75 Post: 3.47 L/sec
FEF 25-75 Pre: 3.86 L/sec
FEF2575-%Change-Post: -10 %
FEF2575-%Pred-Post: 133 %
FEF2575-%Pred-Pre: 148 %
FEV1-%Change-Post: -2 %
FEV1-%Pred-Post: 73 %
FEV1-%Pred-Pre: 75 %
FEV1-Post: 2.08 L
FEV1-Pre: 2.13 L
FEV1FVC-%Change-Post: 0 %
FEV1FVC-%Pred-Pre: 114 %
FEV6-%Change-Post: -3 %
FEV6-%Pred-Post: 65 %
FEV6-%Pred-Pre: 68 %
FEV6-Post: 2.31 L
FEV6-Pre: 2.39 L
FEV6FVC-%Pred-Post: 104 %
FEV6FVC-%Pred-Pre: 104 %
FVC-%Change-Post: -3 %
FVC-%Pred-Post: 63 %
FVC-%Pred-Pre: 65 %
FVC-Post: 2.31 L
FVC-Pre: 2.39 L
Post FEV1/FVC ratio: 90 %
Post FEV6/FVC ratio: 100 %
Pre FEV1/FVC ratio: 89 %
Pre FEV6/FVC Ratio: 100 %
RV % pred: 51 %
RV: 1.12 L
TLC % pred: 56 %
TLC: 3.71 L

## 2020-10-08 NOTE — Progress Notes (Signed)
PFT done today. 

## 2021-04-08 ENCOUNTER — Other Ambulatory Visit: Payer: Self-pay

## 2021-04-08 ENCOUNTER — Encounter: Payer: Self-pay | Admitting: Internal Medicine

## 2021-04-08 ENCOUNTER — Ambulatory Visit (INDEPENDENT_AMBULATORY_CARE_PROVIDER_SITE_OTHER): Payer: No Typology Code available for payment source | Admitting: Internal Medicine

## 2021-04-08 VITALS — BP 126/82 | HR 98 | Temp 98.0°F | Ht 68.0 in | Wt 194.0 lb

## 2021-04-08 DIAGNOSIS — U071 COVID-19: Secondary | ICD-10-CM

## 2021-04-08 DIAGNOSIS — J8410 Pulmonary fibrosis, unspecified: Secondary | ICD-10-CM | POA: Diagnosis not present

## 2021-04-08 NOTE — Patient Instructions (Addendum)
Please schedule follow up scheduled with myself in 6 months.  If my schedule is not open yet, we will contact you with a reminder closer to that time. Please call [phone] if you haven't heard from [place] a month before.   Before your next visit I would like you to have:  Spirometry/DLCO - breathing testing before you see me.

## 2021-04-08 NOTE — Progress Notes (Signed)
Joseph Todd    [id number]    [birth year]  Primary Care Physician:Kulik, Maryruth Hancock, MD Date of Appointment: 04/08/2021 Established Patient Visit  Chief complaint:   Chief Complaint  Patient presents with   Follow-up    Patient reports that he has occasionally shortness of breath when walking.      HPI: Joseph Todd is a 65 y.o. gentleman with COVID 19 infection 2021 with resultant pulmonary fibrosis.  Interval Updates: Here for follow up. Not too much change in his breathing. Able to do everything he wants to do, but does things slowly. Worse with exertion such as going up stairs. PFTs show mild restriction to ventilation with reduced dlco. 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 No hospitalizations or ED visits for breathing.  I have reviewed the patient's family social and past medical history and updated as appropriate.   Past Medical History:  Diagnosis Date   Carpal tunnel syndrome on right    DM (diabetes mellitus) (HCC)    EKG abnormalities    Neck pain    Syncope and collapse     Past Surgical History:  Procedure Laterality Date   CARPAL TUNNEL RELEASE Right     Family History  Problem Relation Age of Onset   Heart disease Father    Hypertension Father    Diabetes Brother    Lung disease Neg Hx     Social History   Occupational History   Not on file  Tobacco Use   Smoking status: Former    Packs/day: 0.25    Types: Cigarettes    Quit date: 1990    Years since quitting: 32.9   Smokeless tobacco: Never   Tobacco comments:    at the most 4 a day.  Substance and Sexual Activity   Alcohol use: Yes    Alcohol/week: 3.0 standard drinks    Types: 3 Cans of beer per week   Drug use: Never   Sexual activity: Not on file     Physical Exam: Blood pressure 126/82, pulse 98, temperature 98 F (36.7 C), temperature source Oral, height 5\' 8"  (1.727 m), weight 194 lb (88 kg), SpO2 96 %.  Gen:      No acute  distress Lungs:    ctab no wheezes or crackles, no increased work of breathing CV:         RRR no mrg   Data Reviewed: Imaging: I have personally reviewed the CT Chest from May 2022 and compared it to Feb 2022. There is dramatic improvement in multifocal airspace opacities. The CT Chest in May has some fine fibrotic changes in both bases as well as some paraseptal emphysema  PFTs:  PFT Results Latest Ref Rng & Units 10/08/2020  FVC-Pre L 2.39  FVC-Predicted Pre % 65  FVC-Post L 2.31  FVC-Predicted Post % 63  Pre FEV1/FVC % % 89  Post FEV1/FCV % % 90  FEV1-Pre L 2.13  FEV1-Predicted Pre % 75  FEV1-Post L 2.08  DLCO uncorrected ml/min/mmHg 18.50  DLCO UNC% % 72  DLCO corrected ml/min/mmHg 18.50  DLCO COR %Predicted % 72  DLVA Predicted % 128  TLC L 3.71  TLC % Predicted % 56  RV % Predicted % 51   Mild restriction to ventilation, reduced dlco  Labs:  Immunization status: Immunization History  Administered Date(s) Administered   Influenza Split 01/13/2016, 02/06/2017   Influenza,inj,Quad PF,6+ Mos 08/14/2020, 02/12/2021   Influenza-Unspecified  06/05/2013   Moderna Covid-19 Vaccine Bivalent Booster 58yrs & up 02/19/2021   Moderna Sars-Covid-2 Vaccination 09/18/2019, 10/08/2019, 04/23/2020   Pneumococcal-Unspecified 05/09/2005   Tdap 11/17/2012    Assessment:  COVID 19 infection Jan 2022 Pulmonary Fibrosis, mild Mild Emphysema History of tobacco use  Plan/Recommendations: PFTs show mild restriction to ventilation. We discussed keeping ongoing activity, continuing to abstain from tobacco use, preventing infection. He has already gotten his flu shot.  I will see him back in 6 months with repeat PFT. After that annually. Goal is to maintain lung function, if we see a decline in symptoms or lung function consider anti-fibrotic therapy.    Return to Care: Return in about 6 months (around 10/07/2021).   Durel Salts, MD Pulmonary and Critical Care Medicine [facility] Office:[phone]

## 2021-05-24 ENCOUNTER — Ambulatory Visit
Admission: EM | Admit: 2021-05-24 | Discharge: 2021-05-24 | Disposition: A | Discharge status: Home / Self Care | Payer: No Typology Code available for payment source | Attending: Physician Assistant | Admitting: Physician Assistant

## 2021-05-24 ENCOUNTER — Other Ambulatory Visit: Payer: Self-pay

## 2021-05-24 ENCOUNTER — Encounter: Payer: Self-pay | Admitting: Emergency Medicine

## 2021-05-24 DIAGNOSIS — J069 Acute upper respiratory infection, unspecified: Secondary | ICD-10-CM

## 2021-05-24 LAB — POCT INFLUENZA A/B
Influenza A, POC: NEGATIVE
Influenza B, POC: NEGATIVE

## 2021-05-24 NOTE — ED Triage Notes (Signed)
Sweating, chills, coughing, nasal congestion x 2 days. Reports having had all flu and covid vaccines. Using nyquil at home to help him sleep. Reports the mucus he does cough up is yellow in color

## 2021-05-24 NOTE — ED Provider Notes (Signed)
[facility]    CSN: [id number] Arrival date & time: 05/24/21  1125      History   Chief Complaint Chief Complaint  Patient presents with   Cough    HPI Joseph Todd is a 66 y.o. male.   Patient here today for evaluation of sweating, chills, nasal congestion and cough that started 2 days ago.  He states he has been taking NyQuil to help with sleep.  Cough is occasionally productive and is yellow in color.  He does not report fever.  He has been vaccinated for flu as well as COVID.  He denies any nausea, vomiting or diarrhea.  The history is provided by the patient.  Cough Associated symptoms: chills, rhinorrhea and sore throat (mild)   Associated symptoms: no eye discharge, no fever and no wheezing    Past Medical History:  Diagnosis Date   Carpal tunnel syndrome on right    DM (diabetes mellitus) ([place])    EKG abnormalities    Neck pain    Syncope and collapse     Patient Active Problem List   Diagnosis Date Noted   Hyperkalemia 06/22/2019   Pneumonia due to COVID-19 virus 06/14/2019   Acute respiratory failure due to COVID-19 ([place]) 06/14/2019   DM (diabetes mellitus) ([place]) 06/10/2019   Acute kidney injury ([place]) 06/10/2019   Syncope 06/09/2019    Past Surgical History:  Procedure Laterality Date   CARPAL TUNNEL RELEASE Right        Home Medications    Prior to Admission medications   Medication Sig Start Date End Date Taking? Authorizing Provider  atorvastatin (LIPITOR) 20 MG tablet TAKE ONE TABLET BY MOUTH DAILY FOR CHOLESTEROL 09/10/19   [provider]  Capsaicin 0.1 % CREA APPLY SMALL AMOUNT TO AFFECTED AREA AT BEDTIME AS NEEDED 03/24/20   [provider]  cetirizine (ZYRTEC) 10 MG tablet TAKE ONE TABLET BY MOUTH DAILY AS NEEDED FOR ALLERGY SYMPTOMS 08/14/20   [provider]  clindamycin (CLEOCIN T) 1 % external solution Apply twice a day on affected areas 06/25/20   [provider]  DULoxetine (CYMBALTA) 60  MG capsule TAKE ONE CAPSULE BY MOUTH DAILY REPLACING SERTRALINE 03/13/20   [provider]  gabapentin (NEURONTIN) 300 MG capsule Take 1 capsule by mouth 3 (three) times daily. 08/14/20   [provider]  glipiZIDE (GLUCOTROL) 10 MG tablet TAKE ONE TABLET BY MOUTH TWICE A DAY FOR DIABETES (TAKE THIS MEDICATION 30 MINUTES PRIOR TO A MEAL) 08/14/19   [provider]  Ipratropium-Albuterol (COMBIVENT) 20-100 MCG/ACT AERS respimat Inhale 1 puff into the lungs every 6 (six) hours as needed for wheezing or shortness of breath. 06/21/19   Arrien, Jimmy Picket, MD  metFORMIN (GLUCOPHAGE) 500 MG tablet Take 1 tablet (500 mg total) by mouth 2 (two) times daily with a meal. 06/25/19 07/25/19  Arrien, Jimmy Picket, MD  Sennosides (SENNA) 8.6 MG CAPS Take 8.6 mg by mouth daily. 06/10/19   Marianna Payment, MD  thiamine 100 MG tablet Take 100 mg by mouth 2 (two) times daily. 04/25/19   [provider]  vitamin B-12 (CYANOCOBALAMIN) 500 MCG tablet Take 2 tablets by mouth daily. 03/23/20   [provider]  Vitamin D, Ergocalciferol, (DRISDOL) 1.25 MG (50000 UNIT) CAPS capsule Take 50,000 Units by mouth once a week. 04/25/19   [provider]    Family History Family History  Problem Relation Age of Onset   Heart disease Father    Hypertension Father  Diabetes Brother    Lung disease Neg Hx     Social History Social History   Tobacco Use   Smoking status: Former    Packs/day: 0.25    Types: Cigarettes    Quit date: 1990    Years since quitting: 33.0   Smokeless tobacco: Never   Tobacco comments:    at the most 4 a day.  Substance Use Topics   Alcohol use: Yes    Alcohol/week: 3.0 standard drinks    Types: 3 Cans of beer per week   Drug use: Never     Allergies   Patient has no known allergies.   Review of Systems Review of Systems  Constitutional:  Positive for chills. Negative for fever.  HENT:  Positive for congestion, rhinorrhea and  sore throat (mild).   Eyes:  Negative for discharge and redness.  Respiratory:  Positive for cough. Negative for wheezing.   Gastrointestinal:  Negative for diarrhea, nausea and vomiting.    Physical Exam Triage Vital Signs ED Triage Vitals [05/24/21 1237]  Enc Vitals Group     BP (!) 149/83     Pulse Rate 94     Resp 16     Temp 98.1 F (36.7 C)     Temp Source Oral     SpO2 95 %     Weight      Height      Head Circumference      Peak Flow      Pain Score 0     Pain Loc      Pain Edu?      Excl. in [place]?    No data found.  Updated Vital Signs BP (!) 149/83 (BP Location: Left Arm)    Pulse 94    Temp 98.1 F (36.7 C) (Oral)    Resp 16    SpO2 95%   Physical Exam Vitals and nursing note reviewed.  Constitutional:      General: He is not in acute distress.    Appearance: Normal appearance. He is not ill-appearing.  HENT:     Head: Normocephalic and atraumatic.     Nose: Congestion present.     Mouth/Throat:     Mouth: Mucous membranes are moist.     Pharynx: Oropharynx is clear. Posterior oropharyngeal erythema present. No oropharyngeal exudate.  Eyes:     Conjunctiva/sclera: Conjunctivae normal.  Cardiovascular:     Rate and Rhythm: Normal rate and regular rhythm.     Heart sounds: Normal heart sounds. No murmur heard. Pulmonary:     Effort: Pulmonary effort is normal. No respiratory distress.     Breath sounds: Normal breath sounds. No wheezing, rhonchi or rales.  Skin:    General: Skin is warm and dry.  Neurological:     Mental Status: He is alert.  Psychiatric:        Mood and Affect: Mood normal.        Thought Content: Thought content normal.     UC Treatments / Results  Labs (all labs ordered are listed, but only abnormal results are displayed) Labs Reviewed  NOVEL CORONAVIRUS, NAA  POCT INFLUENZA A/B    EKG   Radiology No results found.  Procedures Procedures (including critical care time)  Medications Ordered in UC Medications - No  data to display  Initial Impression / Assessment and Plan / UC Course  I have reviewed the triage vital signs and the nursing notes.  Pertinent labs & imaging results that  were available during my care of the patient were reviewed by me and considered in my medical decision making (see chart for details).    Suspect likely viral etiology of symptoms.  Flu test negative in office, will screen for COVID.  Encouraged symptomatic treatment while awaiting results.  Encouraged follow-up with any further concerns or worsening symptoms.  Final Clinical Impressions(s) / UC Diagnoses   Final diagnoses:  Acute upper respiratory infection   Discharge Instructions   None    ED Prescriptions   None    PDMP not reviewed this encounter.   Francene Finders, PA-C 05/24/21 1322

## 2021-05-25 LAB — SARS-COV-2, NAA 2 DAY TAT

## 2021-05-25 LAB — NOVEL CORONAVIRUS, NAA: SARS-CoV-2, NAA: NOT DETECTED

## 2021-10-05 ENCOUNTER — Encounter: Payer: Self-pay | Admitting: Internal Medicine

## 2021-10-05 ENCOUNTER — Ambulatory Visit (INDEPENDENT_AMBULATORY_CARE_PROVIDER_SITE_OTHER): Payer: No Typology Code available for payment source | Admitting: Internal Medicine

## 2021-10-05 VITALS — BP 128/76 | HR 92 | Temp 98.0°F | Ht 68.0 in | Wt 192.0 lb

## 2021-10-05 DIAGNOSIS — J8410 Pulmonary fibrosis, unspecified: Secondary | ICD-10-CM | POA: Diagnosis not present

## 2021-10-05 NOTE — Progress Notes (Signed)
Joseph Todd    [id number]    1955/12/01  Primary Care Physician:Pcp, No Date of Appointment: 10/05/2021 Established Patient Visit  Chief complaint:   Chief Complaint  Patient presents with   Follow-up    He has shortness of breath with exertion and about the same since last OV.      HPI: Joseph Todd is a 66 y.o. gentleman with COVID 19 infection 2021 with resultant pulmonary fibrosis.  Interval Updates: Here for follow up. No interval change in breathing. He is dealing with some chronic pain issues in neck back, knees, shoulders, multiple sites. This is his main concern.  Enjoying retirement and spending time with his grandchildren.  No hospitalizations for breathing, no pneumonia.   Continues to have mild dyspnea going up stairs/incline.  Can walk 3 city blocks before needing to stop and rest. No cough, wheezing, chest tightness   I have reviewed the patient's family social and past medical history and updated as appropriate.   Past Medical History:  Diagnosis Date   Carpal tunnel syndrome on right    DM (diabetes mellitus) (HCC)    EKG abnormalities    Neck pain    Syncope and collapse     Past Surgical History:  Procedure Laterality Date   CARPAL TUNNEL RELEASE Right     Family History  Problem Relation Age of Onset   Heart disease Father    Hypertension Father    Diabetes Brother    Lung disease Neg Hx     Social History   Occupational History   Not on file  Tobacco Use   Smoking status: Former    Packs/day: 0.25    Types: Cigarettes    Quit date: 1990    Years since quitting: 33.4   Smokeless tobacco: Never   Tobacco comments:    at the most 4 a day.  Substance and Sexual Activity   Alcohol use: Yes    Alcohol/week: 3.0 standard drinks    Types: 3 Cans of beer per week   Drug use: Never   Sexual activity: Not on file     Physical Exam: Blood pressure 128/76, pulse 92, temperature 98 F (36.7 C), temperature source Oral,  height 5\' 8"  (1.727 m), weight 192 lb (87.1 kg), SpO2 100 %.  Gen:      No acute distress Lungs:    ctab no wheezes or crackles, no increased wob CV:        RRR no mrg   Data Reviewed: Imaging: I have personally reviewed the CT Chest from May 2022 and compared it to Feb 2022. There is dramatic improvement in multifocal airspace opacities. The CT Chest in May has some fine fibrotic changes in both bases as well as some paraseptal emphysema  PFTs:     Latest Ref Rng & Units 10/08/2020   11:58 AM  PFT Results  FVC-Pre L 2.39    FVC-Predicted Pre % 65    FVC-Post L 2.31    FVC-Predicted Post % 63    Pre FEV1/FVC % % 89    Post FEV1/FCV % % 90    FEV1-Pre L 2.13    FEV1-Predicted Pre % 75    FEV1-Post L 2.08    DLCO uncorrected ml/min/mmHg 18.50    DLCO UNC% % 72    DLCO corrected ml/min/mmHg 18.50    DLCO COR %Predicted % 72    DLVA Predicted % 128    TLC L 3.71  TLC % Predicted % 56    RV % Predicted % 51     Mild restriction to ventilation, reduced dlco  Spirometry repeated 10/05/21 shows stable FVC Labs:  Immunization status: Immunization History  Administered Date(s) Administered   Influenza Split 01/13/2016, 02/06/2017   Influenza,inj,Quad PF,6+ Mos 08/14/2020, 02/12/2021   Influenza-Unspecified 06/05/2013   Moderna Covid-19 Vaccine Bivalent Booster 75yrs & up 02/19/2021   Moderna Sars-Covid-2 Vaccination 09/18/2019, 10/08/2019, 04/23/2020   Pneumococcal-Unspecified 05/09/2005   Tdap 11/17/2012    Assessment:  COVID 19 infection Jan 2022 Pulmonary Fibrosis following Covid, mild restriction to ventilation Mild Emphysema History of tobacco use  Plan/Recommendations: Spirometry shows stable FVC. Symptoms not dramatically worse  We discussed possibility of therapy with anti-fibrotics and that ongoing research is in progress to see if these medications would be applicable. Continue to monitor off therapy for now.   Return to Care: Return in about 1 year  (around 10/06/2022).   Durel Salts, MD Pulmonary and Critical Care Medicine [facility] Office:[phone]

## 2021-10-05 NOTE — Patient Instructions (Signed)
Please schedule follow up scheduled with myself in 12 months.  If my schedule is not open yet, we will contact you with a reminder closer to that time. Please call [phone] if you haven't heard from [place] a month before.   Before your next visit I would like you to have:  Full set of PFTs - 1 year from now   Come see me sooner if any issues or concerns about your breathing. Lung function today was stable .

## 2021-12-22 IMAGING — CT CT CHEST HIGH RESOLUTION W/O CM
2 of 7 series · 14 of 36 positions shown, 17 images · non-contrast
Comparison: 06/18/2019

CLINICAL DATA: Pulmonary fibrosis, history of COVID infection August 2019, residual dyspnea, evaluate for fibrosis

EXAM:
CT CHEST WITHOUT CONTRAST
TECHNIQUE: Multidetector CT imaging of the chest was performed following the
standard protocol without intravenous contrast. High resolution
imaging of the lungs, as well as inspiratory and expiratory imaging,
was performed.

[Series 4: high resolution · axial · 0.67mm/px · z∈[-304,-64]mm · 11 of 288 slices shown, 14 images]
[im 24/288  mediastinal]
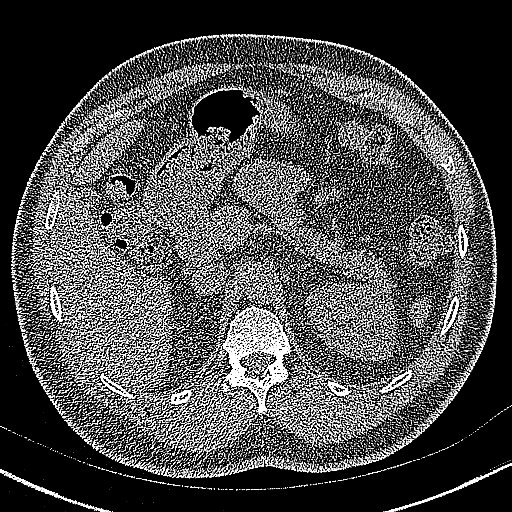
[im 24/288  lung]
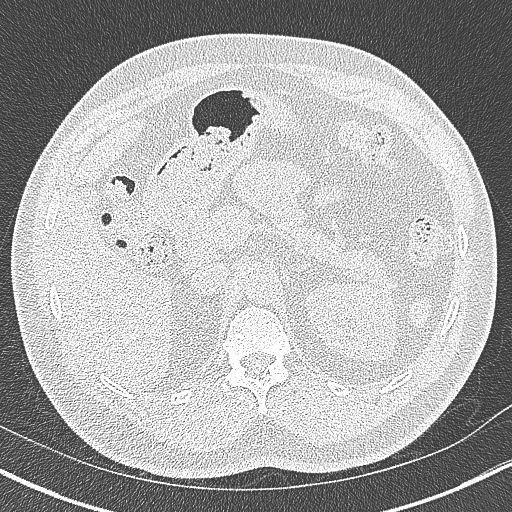
[im 48/288  lung]
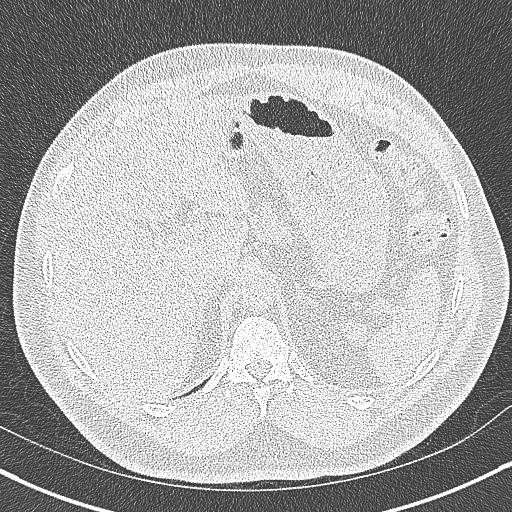
[im 72/288  lung]
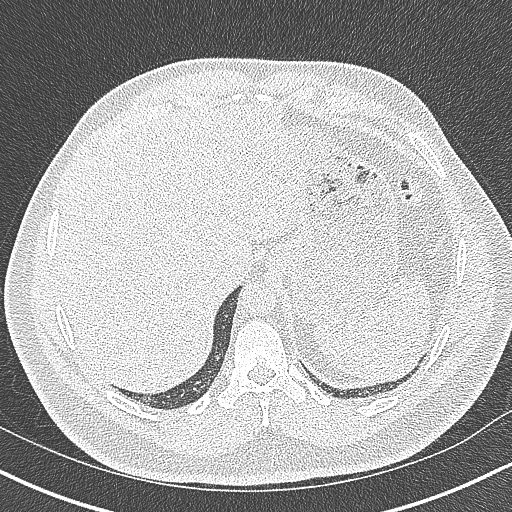
[im 96/288  lung]
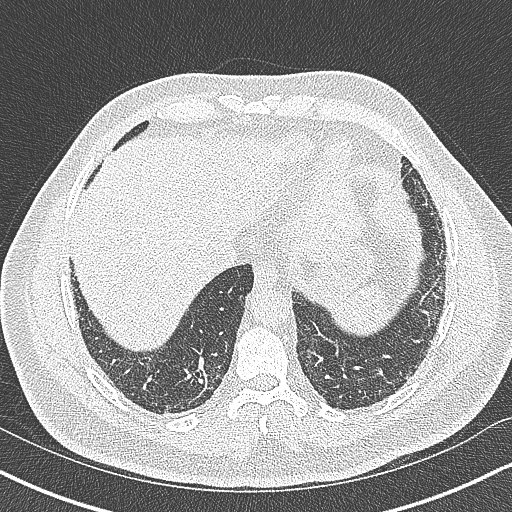
[im 120/288  mediastinal]
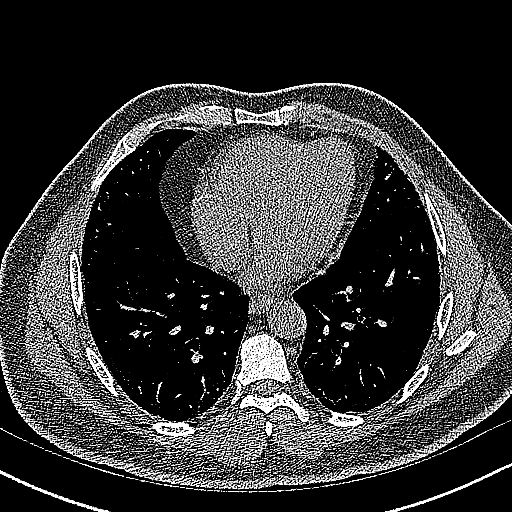
[im 120/288  lung]
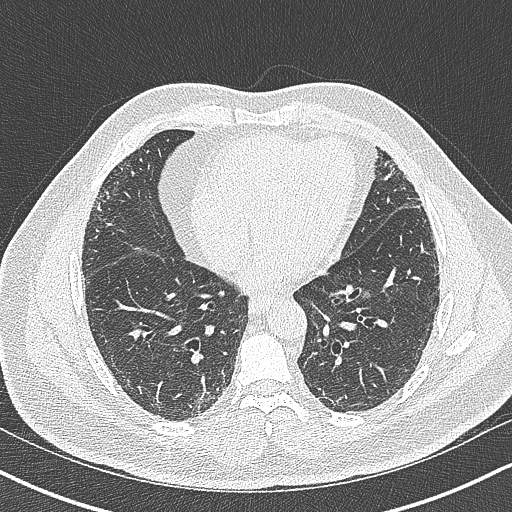
[im 144/288  lung]
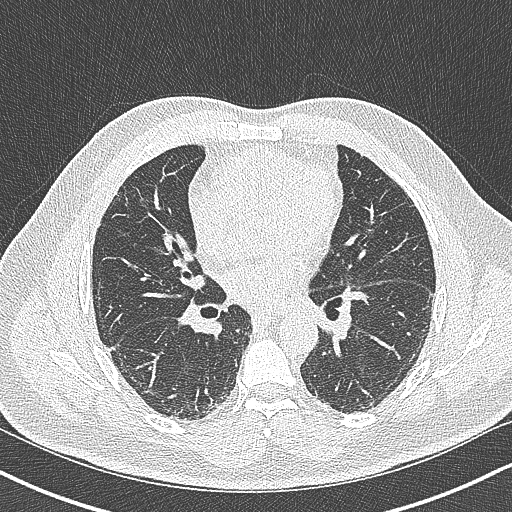
[im 168/288  lung]
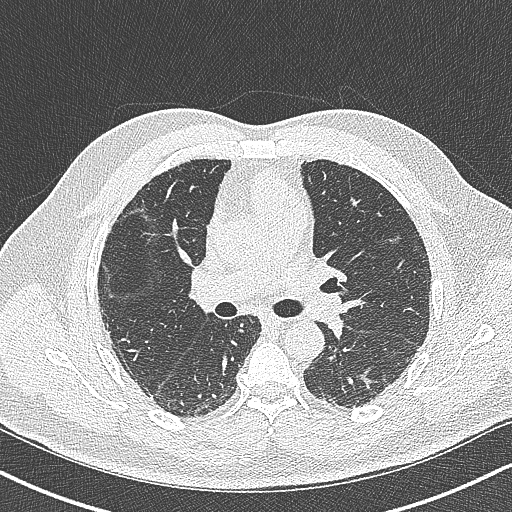
[im 192/288  lung]
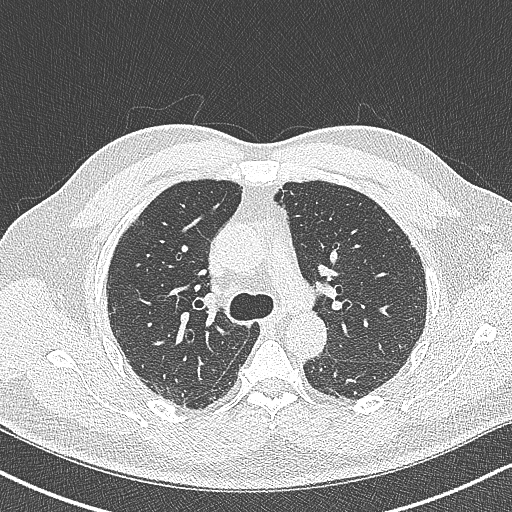
[im 216/288  mediastinal]
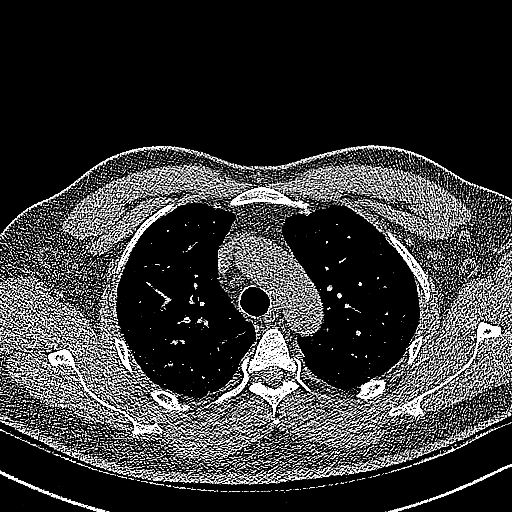
[im 216/288  lung]
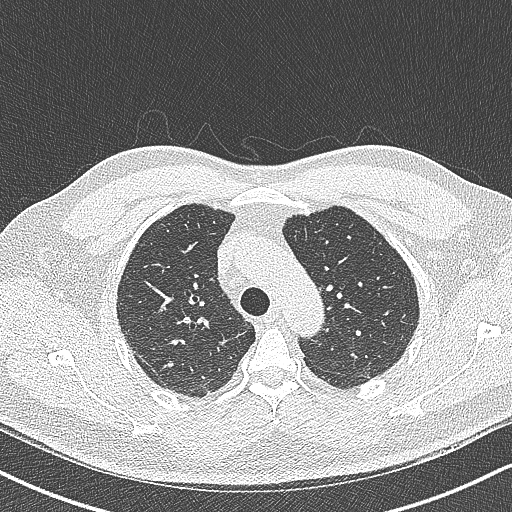
[im 240/288  lung]
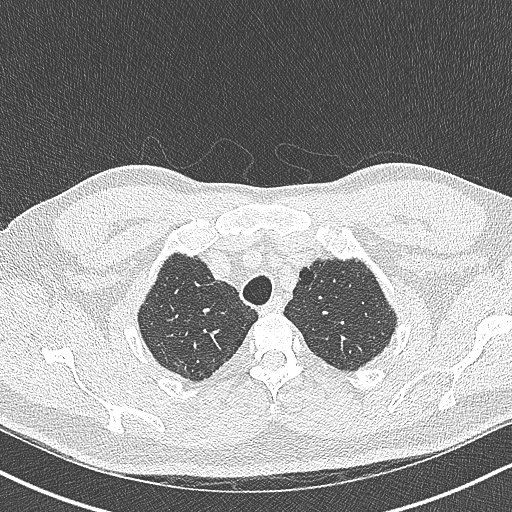
[im 264/288  lung]
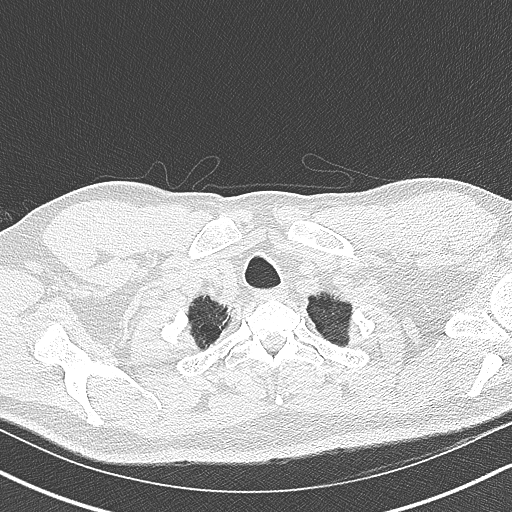

[Series 6: coronal · coronal · 0.59mm/px · 3 of 125 slices shown]
[im 25/125  lung]
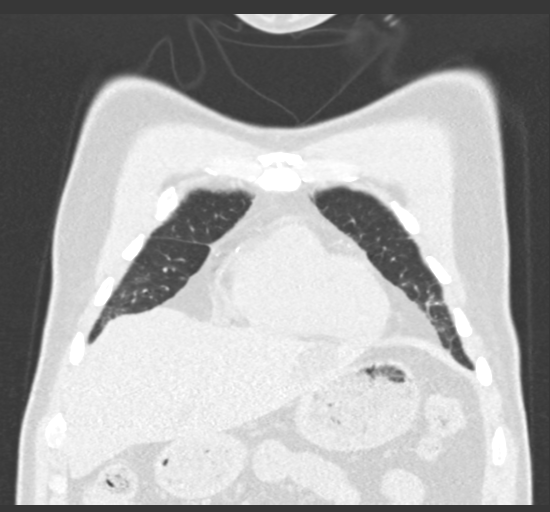
[im 50/125  lung]
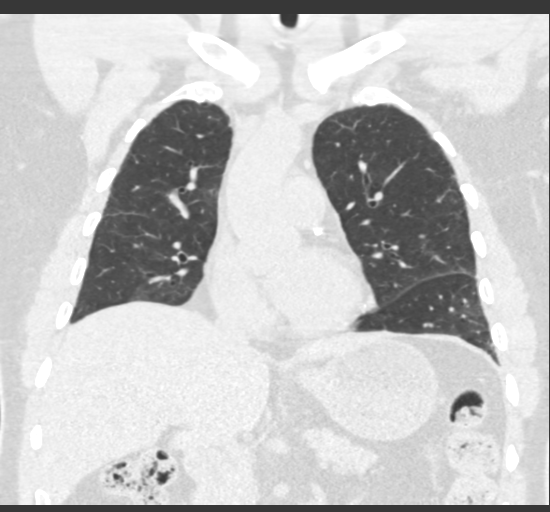
[im 75/125  lung]
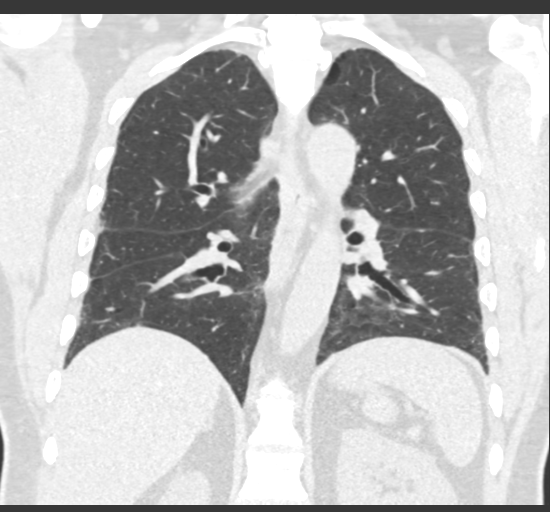

[14 of 36 positions shown; findings below may reference images not displayed]

FINDINGS: Cardiovascular: Three-vessel coronary artery calcifications. Normal
heart size. No pericardial effusion.

Mediastinum/Nodes: No enlarged mediastinal, hilar, or axillary lymph
nodes. Thyroid gland, trachea, and esophagus demonstrate no
significant findings.

Lungs/Pleura: Mild paraseptal emphysema. Previously seen extensive
acute airspace disease has resolved. There is mild, predominantly
bandlike irregular peripheral interstitial opacity throughout the
lungs, most conspicuous at the lung bases, with some evidence of
arcing peripheral elements and subpleural sparing. Mild, lobular air
trapping on expiratory phase imaging no pleural effusion or
pneumothorax.

Upper Abdomen: No acute abnormality. Cyst or hemangioma of the left
lobe of the liver.

Musculoskeletal: No chest wall mass or suspicious bone lesions
identified.
IMPRESSION: 1. Previously seen extensive acute airspace disease has resolved.
2. There is mild, predominantly bandlike irregular peripheral
interstitial opacity throughout the lungs, most conspicuous at the
lung bases, with some evidence of arcing peripheral elements and
subpleural sparing. This chronic organizing pneumonia appearance is
consistent with chronic fibrotic sequelae of prior COVID airspace
disease.
3. Mild, lobular air trapping on expiratory phase imaging,
suggestive of small airways disease.
4. Mild emphysema.
5. Coronary artery disease.

Emphysema ([id number]).

## 2022-01-06 ENCOUNTER — Ambulatory Visit
Payer: No Typology Code available for payment source | Attending: Rehabilitative and Restorative Service Providers" | Admitting: Rehabilitative and Restorative Service Providers"

## 2022-01-20 ENCOUNTER — Ambulatory Visit: Payer: No Typology Code available for payment source

## 2022-01-26 ENCOUNTER — Ambulatory Visit: Payer: No Typology Code available for payment source | Attending: Rehabilitative and Restorative Service Providers"

## 2022-01-26 DIAGNOSIS — R29898 Other symptoms and signs involving the musculoskeletal system: Secondary | ICD-10-CM | POA: Insufficient documentation

## 2022-01-26 DIAGNOSIS — M542 Cervicalgia: Secondary | ICD-10-CM | POA: Insufficient documentation

## 2022-01-26 NOTE — Therapy (Signed)
OUTPATIENT PHYSICAL THERAPY NEURO EVALUATION   Patient Name: Joseph Todd MRN: [id number] DOB:1955/10/10, 66 y.o., male Today's Date: 01/26/2022   PCP: None listed REFERRING PROVIDER: Pearson Grippe, MD    PT End of Session - 01/26/22 1342     Visit Number 1    Number of Visits 4    Date for PT Re-Evaluation 02/23/22    Authorization Type [facility]    Authorization - Visit Number 1    Authorization - Number of Visits 15    PT Start Time 1350    PT Stop Time 1435    PT Time Calculation (min) 45 min    Activity Tolerance Patient tolerated treatment well    Behavior During Therapy WFL for tasks assessed/performed             Past Medical History:  Diagnosis Date   Carpal tunnel syndrome on right    DM (diabetes mellitus) (HCC)    EKG abnormalities    Neck pain    Syncope and collapse    Past Surgical History:  Procedure Laterality Date   CARPAL TUNNEL RELEASE Right    Patient Active Problem List   Diagnosis Date Noted   Hyperkalemia 06/22/2019   Pneumonia due to COVID-19 virus 06/14/2019   Acute respiratory failure due to COVID-19 (HCC) 06/14/2019   DM (diabetes mellitus) (HCC) 06/10/2019   Acute kidney injury (HCC) 06/10/2019   Syncope 06/09/2019    ONSET DATE: "some years"   REFERRING DIAG: M46.09 (ICD-10-CM) - Spinal enthesopathy, 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 Pt reports recent MRI reveals bulging discs and pinched nerves in the neck and back.  This issue has been chronic and on-going. Patient reports he has had hx of spinal injections to his  spine but notes limited relief. 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  Difficulty sleeping due to pain and use of CPAP Pt accompanied by: self  PERTINENT HISTORY: DM, OSA. Pt reports hx of PT for neck and back pain and hx of aquatic PT  PAIN:  Are you having pain? Yes: NPRS scale: 4/10 Pain location: neck, shoulders, center of back, right fingers Pain description: dull in  neck, sharp in shoulders, right ulnar fingers sharp Aggravating factors: lifting, turning the wrong way Relieving factors: "not much" medication dulls it down  PRECAUTIONS: None  WEIGHT BEARING RESTRICTIONS No  FALLS: Has patient fallen in last 6 months? Yes. Number of falls 1  LIVING ENVIRONMENT: Lives with: lives with their spouse Lives in: House/apartment Stairs: Yes: Internal: 12 steps; can reach both Has following equipment at home: None  PLOF: Independent  PATIENT GOALS reduce pain  OBJECTIVE:   DIAGNOSTIC FINDINGS: MRI   COGNITION: Overall cognitive status: Within functional limits for tasks assessed   SENSATION: Not tested  COORDINATION: WNL  EDEMA:    MUSCLE TONE:  Intrinsic atrophy right hand  MUSCLE LENGTH:  DTRs:  DNT  POSTURE:  Decreased cervical lordosis evident  Cervical ROM: extension: 10% limited      Left rotation and sidebending 50% limited      Right rotation/sidebending 25% limited   UE MMT: RUE demo C4-T1 myotome weakness    LOWER EXTREMITY MMT:    MMT Right Eval Left Eval  Hip flexion    Hip extension    Hip abduction    Hip adduction    Hip internal rotation    Hip external rotation    Knee flexion    Knee extension    Ankle dorsiflexion    Ankle plantarflexion    Ankle inversion    Ankle eversion    (Blank rows = not tested)  BED MOBILITY:  independent  TRANSFERS: independent    GAIT: Gait pattern: WFL Distance walked:  Assistive device utilized: None Level of assistance: Complete  Independence Comments:   FUNCTIONAL TESTs:    PATIENT SURVEYS:  None indicated  TODAY'S TREATMENT:  Evaluation, HEP initiation   PATIENT EDUCATION: Education details: condition/symptom mgmt. Benefits of increased intensity CV exercise and use of RPE for gauge Person educated: Patient Education method: Explanation Education comprehension: verbalized understanding   HOME EXERCISE PROGRAM: Access Code: [email or web address] URL: [email or web address] Date: 01/26/2022 Prepared by: Shary Decamp  Exercises - Mid-Lower Cervical Extension SNAG with Strap  - 1 x daily - 7 x weekly - 3 sets - 10 reps - Seated Assisted Cervical Rotation with Towel  - 1 x daily - 7 x weekly - 3 sets - 10 reps - Supine Neck Traction with Doorway--only utilized title, not picture/action demonstrated. Advised pt to hang head and RUE over side of bed for self-traction    GOALS: Goals reviewed with patient? Yes  SHORT TERM GOALS: Target date: 02/09/2022  Patient will be independent in HEP to improve functional outcomes Baseline: Goal status: INITIAL    LONG TERM GOALS: Target date: 02/23/2022  Demonstrate improved left cervical rotation to 10% restricted to improve comfort with driving activities Baseline: 50% limited Goal status: INITIAL  2.  Report/demonstrate self-management strategies to improve sleep comfort Baseline:  Goal status: INITIAL    ASSESSMENT:  CLINICAL IMPRESSION: Patient is a 66 y.o. man who was seen today for physical therapy evaluation and treatment for chronic neck and back pain. Pt has hx of neck pain and chronic peripheral nerve injury of RUE/hand who presents with disruptions in activity tolerance and sleep disturbance related to neck and upper back pain. Demo limited cervical spine ROM and postural deviations that may be contributing to some of his symptom presentation. PT services indicated to intervene and provide treatment strategies to improve function and  decrease pain to improve activity tolerance and enhance quality of life.    OBJECTIVE IMPAIRMENTS decreased activity tolerance, decreased  ROM, decreased strength, impaired perceived functional ability, impaired UE functional use, postural dysfunction, and pain.   ACTIVITY LIMITATIONS carrying, lifting, sleeping, and locomotion level  PARTICIPATION LIMITATIONS: meal prep, cleaning, community activity, and yard work  PERSONAL FACTORS Time since onset of injury/illness/exacerbation and 1 comorbidity: DM  are also affecting patient's functional outcome.   REHAB POTENTIAL: Fair based on time since onset, interaction of conditions  CLINICAL DECISION MAKING: Stable/uncomplicated  EVALUATION COMPLEXITY: Low  PLAN: PT FREQUENCY: 1x/week  PT DURATION: 4 weeks  PLANNED INTERVENTIONS: Therapeutic exercises, Therapeutic activity, Neuromuscular re-education, Balance training, Gait training, Patient/Family education, Self Care, Joint mobilization, Stair training, Vestibular training, Canalith repositioning, DME instructions, Aquatic Therapy, Dry Needling, Electrical stimulation, Spinal mobilization, Cryotherapy, Moist heat, Taping, Traction, Ionotophoresis 4mg /ml Dexamethasone, and Manual therapy  PLAN FOR NEXT SESSION: T-spine assessment   4:44 PM, 01/26/22 M. Sherlyn Lees, PT, DPT Physical Therapist- [place] Office Number: [phone]

## 2022-01-31 ENCOUNTER — Ambulatory Visit: Payer: No Typology Code available for payment source

## 2022-02-07 ENCOUNTER — Ambulatory Visit: Payer: No Typology Code available for payment source | Attending: Rehabilitative and Restorative Service Providers"

## 2022-02-07 DIAGNOSIS — M542 Cervicalgia: Secondary | ICD-10-CM | POA: Insufficient documentation

## 2022-02-07 DIAGNOSIS — R29898 Other symptoms and signs involving the musculoskeletal system: Secondary | ICD-10-CM | POA: Insufficient documentation

## 2022-02-07 NOTE — Therapy (Signed)
OUTPATIENT PHYSICAL THERAPY NEURO TREATMENT   Patient Name: Joseph Todd MRN: [id number] DOB:Oct 25, 1955, 66 y.o., male Today's Date: 02/07/2022   PCP: None listed REFERRING PROVIDER: Stan Head, MD    PT End of Session - 02/07/22 0759     Visit Number 2    Number of Visits 4    Date for PT Re-Evaluation 02/23/22    Authorization Type [facility]    Authorization - Visit Number 2    Authorization - Number of Visits 15    PT Start Time 0800    PT Stop Time 0845    PT Time Calculation (min) 45 min    Activity Tolerance Patient tolerated treatment well    Behavior During Therapy WFL for tasks assessed/performed             Past Medical History:  Diagnosis Date   Carpal tunnel syndrome on right    DM (diabetes mellitus) ([place])    EKG abnormalities    Neck pain    Syncope and collapse    Past Surgical History:  Procedure Laterality Date   CARPAL TUNNEL RELEASE Right    Patient Active Problem List   Diagnosis Date Noted   Hyperkalemia 06/22/2019   Pneumonia due to COVID-19 virus 06/14/2019   Acute respiratory failure due to COVID-19 ([place]) 06/14/2019   DM (diabetes mellitus) ([place]) 06/10/2019   Acute kidney injury ([place]) 06/10/2019   Syncope 06/09/2019    ONSET DATE: "some years"   REFERRING DIAG: M46.09 (ICD-10-CM) - Spinal enthesopathy, 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 Does have a f/u with neurologist after PT sessions are over for further neck assessment.  Pt accompanied by: self  PERTINENT HISTORY: DM, OSA. Pt reports hx of  PT for neck and back pain and hx of aquatic PT  PAIN:  Are you having pain? Yes: NPRS scale: 4/10 Pain location: neck, shoulders, center of back, right fingers Pain description: dull in neck, sharp in shoulders, right ulnar fingers sharp Aggravating factors: lifting, turning the wrong way Relieving factors: "not much" medication dulls it down  PRECAUTIONS: None  WEIGHT BEARING RESTRICTIONS No  FALLS: Has patient fallen in last 6 months? Yes. Number of  falls 1  LIVING ENVIRONMENT: Lives with: lives with their spouse Lives in: House/apartment Stairs: Yes: Internal: 12 steps; can reach both Has following equipment at home: None  PLOF: Independent  PATIENT GOALS reduce pain  OBJECTIVE:   TODAY'S TREATMENT: 02/07/22 Activity Comments  Pt education Use of MHP and discussion regarding alternative modalities for pain control, e.g. accupuncture, at-home traction. Infrared heating pad  Wrist flexor stretch+shoulder depression+cervical rotation (like a nerve tension stretch) Palm on countertop  Chin retractions  10x 3 sec hold. Visual and tactile cues for isolation and movement  Seated Assisted Cervical Rotation with Towel  10x  Mid-Lower Cervical Extension SNAG with Strap 10x          HOME EXERCISE PROGRAM: Access Code: [email or web address] URL: [email or web address] Date: 01/26/2022 Prepared by: Sherlyn Lees  Exercises - Mid-Lower Cervical Extension SNAG with Strap  - 1 x daily - 7 x weekly - 3 sets - 10 reps - Seated Assisted Cervical Rotation with Towel  - 1 x daily - 7 x weekly - 3 sets - 10 reps - Supine Neck Traction with Doorway--only utilized title, not picture/action demonstrated. Advised pt to hang head and RUE over side of bed for self-traction. - Wrist Flexor Stretch on Table  - 1 x daily - 7 x weekly - 3 sets - 10 reps - Seated Cervical Retraction  - 1 x daily - 7 x weekly - 1-3 sets - 10 reps - 3 sec hold   DIAGNOSTIC FINDINGS: MRI: degenerative disc disease at  C3-4, C4-5, C5-6, C6-7, C7-T1. Patient has graded central canal stenosis at the C3-4 level from broad-based disc bulge and bilateral uncovertebral joint hypertrophy causing moderate central canal stenosis. At C4-5, C5-6, C6-7, C7-T1 there is varying levels of neuroforaminal and central cervical spinal stenosis from degenerative disc disease and uncovertebral joint hypertrophy. No evidence of cord signal abnormality.  COGNITION: Overall cognitive status: Within functional limits for tasks assessed   SENSATION: Not tested  COORDINATION: WNL  EDEMA:    MUSCLE TONE:  Intrinsic atrophy right hand  MUSCLE LENGTH:  DTRs:  DNT  POSTURE:  Decreased cervical lordosis evident  Cervical ROM: extension: 10% limited      Left rotation and sidebending 50% limited      Right rotation/sidebending 25% limited   UE MMT: RUE demo C4-T1 myotome weakness    LOWER EXTREMITY MMT:    MMT Right Eval Left Eval  Hip flexion    Hip extension    Hip abduction    Hip adduction    Hip internal rotation    Hip external rotation    Knee flexion    Knee extension    Ankle dorsiflexion    Ankle plantarflexion    Ankle inversion    Ankle eversion    (Blank rows = not tested)  BED MOBILITY:  independent  TRANSFERS: independent    GAIT: Gait pattern: WFL Distance walked:  Assistive device utilized: None Level of assistance: Complete Independence Comments:   FUNCTIONAL TESTs:    PATIENT SURVEYS:  None indicated  TODAY'S TREATMENT:  Evaluation, HEP initiation   PATIENT EDUCATION: Education details: condition/symptom mgmt. Benefits of increased intensity CV exercise and use of RPE for gauge Person educated: Patient Education method: Explanation Education comprehension: verbalized understanding       GOALS: Goals reviewed with patient? Yes  SHORT TERM GOALS: Target date: 02/09/2022  Patient will be independent in HEP to improve functional outcomes Baseline: Goal  status: on-going    LONG TERM GOALS: Target  date: 02/23/2022  Demonstrate improved left cervical rotation to 10% restricted to improve comfort with driving activities Baseline: 50% limited Goal status: INITIAL  2.  Report/demonstrate self-management strategies to improve sleep comfort Baseline:  Goal status: INITIAL    ASSESSMENT:  CLINICAL IMPRESSION: Discussion of symptom management and various techniques and modalities for self-management/coping.  Good return demonstration of initial HEP with addition of new activities for stretching/nerve tension and postural correction. Initial difficulty with chin retractions requiring multi-modal assistance but good coordination thereafter. Continued sessions to improve self-efficacy   OBJECTIVE IMPAIRMENTS decreased activity tolerance, decreased ROM, decreased strength, impaired perceived functional ability, impaired UE functional use, postural dysfunction, and pain.   ACTIVITY LIMITATIONS carrying, lifting, sleeping, and locomotion level  PARTICIPATION LIMITATIONS: meal prep, cleaning, community activity, and yard work  PERSONAL FACTORS Time since onset of injury/illness/exacerbation and 1 comorbidity: DM  are also affecting patient's functional outcome.   REHAB POTENTIAL: Fair based on time since onset, interaction of conditions  CLINICAL DECISION MAKING: Stable/uncomplicated  EVALUATION COMPLEXITY: Low  PLAN: PT FREQUENCY: 1x/week  PT DURATION: 4 weeks  PLANNED INTERVENTIONS: Therapeutic exercises, Therapeutic activity, Neuromuscular re-education, Balance training, Gait training, Patient/Family education, Self Care, Joint mobilization, Stair training, Vestibular training, Canalith repositioning, DME instructions, Aquatic Therapy, Dry Needling, Electrical stimulation, Spinal mobilization, Cryotherapy, Moist heat, Taping, Traction, Ionotophoresis 4mg /ml Dexamethasone, and Manual therapy  PLAN FOR NEXT SESSION: T-spine  assessment   7:59 AM, 02/07/22 M. Sherlyn Lees, PT, DPT Physical Therapist- [place] Office Number: [phone]

## 2022-02-14 ENCOUNTER — Ambulatory Visit: Payer: No Typology Code available for payment source

## 2022-02-14 DIAGNOSIS — M542 Cervicalgia: Secondary | ICD-10-CM

## 2022-02-14 DIAGNOSIS — R29898 Other symptoms and signs involving the musculoskeletal system: Secondary | ICD-10-CM

## 2022-02-14 NOTE — Therapy (Signed)
OUTPATIENT PHYSICAL THERAPY NEURO TREATMENT   Patient Name: Joseph Todd MRN: [id number] DOB:[birth year], 66 y.o., male Today's Date: 02/14/2022   PCP: None listed REFERRING PROVIDER: Stan Head, MD    PT End of Session - 02/14/22 0801     Visit Number 3    Number of Visits 4    Date for PT Re-Evaluation 02/23/22    Authorization Type [facility]    Authorization - Visit Number 3    Authorization - Number of Visits 15    PT Start Time 0800    PT Stop Time 0830    PT Time Calculation (min) 30 min    Activity Tolerance Patient tolerated treatment well    Behavior During Therapy WFL for tasks assessed/performed             Past Medical History:  Diagnosis Date   Carpal tunnel syndrome on right    DM (diabetes mellitus) ([place])    EKG abnormalities    Neck pain    Syncope and collapse    Past Surgical History:  Procedure Laterality Date   CARPAL TUNNEL RELEASE Right    Patient Active Problem List   Diagnosis Date Noted   Hyperkalemia 06/22/2019   Pneumonia due to COVID-19 virus 06/14/2019   Acute respiratory failure due to COVID-19 ([place]) 06/14/2019   DM (diabetes mellitus) ([place]) 06/10/2019   Acute kidney injury ([place]) 06/10/2019   Syncope 06/09/2019    ONSET DATE: "some years"   REFERRING DIAG: M46.09 (ICD-10-CM) - Spinal enthesopathy, 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 Pt reports hx of PT for neck and back pain and hx of aquatic PT  PAIN:  Are you having pain? Yes: NPRS scale: 4/10 Pain location: neck,  shoulders, center of back, right fingers Pain description: dull in neck, sharp in shoulders, right ulnar fingers sharp Aggravating factors: lifting, turning the wrong way Relieving factors: "not much" medication dulls it down  PRECAUTIONS: None  WEIGHT BEARING RESTRICTIONS No  FALLS: Has patient fallen in last 6 months? Yes. Number of falls 1  LIVING ENVIRONMENT: Lives with: lives with their spouse Lives in: House/apartment Stairs: Yes: Internal: 12 steps; can  reach both Has following equipment at home: None  PLOF: Independent  PATIENT GOALS reduce pain  OBJECTIVE:   TODAY'S TREATMENT: 02/14/22 Activity Comments  TENS trial demonstration and review   Chin retraction 10x 3 sec hold   Wrist flexor stretch+shoulder depression+cervical rotation (like a nerve tension stretch) Palm on countertop  Single arm row 2x10 Doubled blue t-band          TODAY'S TREATMENT: 02/07/22 Activity Comments  Pt education Use of MHP and discussion regarding alternative modalities for pain control, e.g. accupuncture, at-home traction. Infrared heating pad  Wrist flexor stretch+shoulder depression+cervical rotation (like a nerve tension stretch) Palm on countertop  Chin retractions  10x 3 sec hold. Visual and tactile cues for isolation and movement  Seated Assisted Cervical Rotation with Towel  10x  Mid-Lower Cervical Extension SNAG with Strap 10x          HOME EXERCISE PROGRAM: Access Code: [email or web address] URL: [email or web address] Date: 01/26/2022 Prepared by: Shary Decamp  Exercises - Mid-Lower Cervical Extension SNAG with Strap  - 1 x daily - 7 x weekly - 3 sets - 10 reps - Seated Assisted Cervical Rotation with Towel  - 1 x daily - 7 x weekly - 3 sets - 10 reps - Supine Neck Traction with Doorway--only utilized title, not picture/action demonstrated. Advised pt to hang head and RUE over side of bed for self-traction. - Wrist Flexor Stretch on Table  - 1 x daily - 7 x weekly - 3  sets - 10 reps - Seated Cervical Retraction  - 1 x daily - 7 x weekly - 1-3 sets - 10 reps - 3 sec hold -- Standing Single Arm Low Row with Anchored Resistance  - 1 x daily - 7 x weekly - 3 sets - 10 reps   DIAGNOSTIC FINDINGS: MRI: degenerative disc disease at C3-4, C4-5, C5-6, C6-7, C7-T1. Patient has graded central canal stenosis at the C3-4 level from broad-based disc bulge and bilateral uncovertebral joint hypertrophy causing moderate central canal stenosis. At C4-5, C5-6, C6-7, C7-T1 there is varying levels of neuroforaminal and central cervical spinal stenosis from degenerative disc disease and uncovertebral joint hypertrophy. No evidence of cord signal abnormality.  COGNITION: Overall cognitive status: Within functional limits for tasks assessed   SENSATION: Not tested  COORDINATION: WNL  EDEMA:    MUSCLE TONE:  Intrinsic atrophy right hand  MUSCLE LENGTH:  DTRs:  DNT  POSTURE:  Decreased cervical lordosis evident  Cervical ROM: extension: 10% limited      Left rotation and sidebending 50% limited      Right rotation/sidebending 25% limited   UE MMT: RUE demo C4-T1 myotome weakness    LOWER EXTREMITY MMT:    MMT Right Eval Left Eval  Hip flexion    Hip extension    Hip abduction    Hip adduction    Hip internal rotation    Hip external rotation    Knee flexion    Knee extension    Ankle dorsiflexion    Ankle plantarflexion    Ankle inversion    Ankle eversion    (Blank rows = not tested)  BED MOBILITY:  independent  TRANSFERS: independent    GAIT: Gait pattern: WFL Distance walked:  Assistive device utilized: None Level of assistance: Complete Independence Comments:   FUNCTIONAL TESTs:    PATIENT SURVEYS:  None indicated  TODAY'S TREATMENT:  Evaluation, HEP initiation   PATIENT EDUCATION: Education details: condition/symptom mgmt. Benefits of increased intensity CV exercise and use of  RPE for gauge Person educated:  Patient Education method: Explanation Education comprehension: verbalized understanding       GOALS: Goals reviewed with patient? Yes  SHORT TERM GOALS: Target date: 02/09/2022  Patient will be independent in HEP to improve functional outcomes Baseline: Goal status: on-going    LONG TERM GOALS: Target date: 02/23/2022  Demonstrate improved left cervical rotation to 10% restricted to improve comfort with driving activities Baseline: 50% limited Goal status: INITIAL  2.  Report/demonstrate self-management strategies to improve sleep comfort Baseline:  Goal status: INITIAL    ASSESSMENT:  CLINICAL IMPRESSION: Demonstration and use of portable TENS unit during session to illustrate benefits of modality and being able to stay mobile with its use of pain control. Pt reports relief with device. Review of HEP with good recall for postural correction and addition of shoulder strengthening for posterior complex using heavy resistance. Brief cue for form with good carryover.    OBJECTIVE IMPAIRMENTS decreased activity tolerance, decreased ROM, decreased strength, impaired perceived functional ability, impaired UE functional use, postural dysfunction, and pain.   ACTIVITY LIMITATIONS carrying, lifting, sleeping, and locomotion level  PARTICIPATION LIMITATIONS: meal prep, cleaning, community activity, and yard work  PERSONAL FACTORS Time since onset of injury/illness/exacerbation and 1 comorbidity: DM  are also affecting patient's functional outcome.   REHAB POTENTIAL: Fair based on time since onset, interaction of conditions  CLINICAL DECISION MAKING: Stable/uncomplicated  EVALUATION COMPLEXITY: Low  PLAN: PT FREQUENCY: 1x/week  PT DURATION: 4 weeks  PLANNED INTERVENTIONS: Therapeutic exercises, Therapeutic activity, Neuromuscular re-education, Balance training, Gait training, Patient/Family education, Self Care, Joint mobilization, Stair training, Vestibular training,  Canalith repositioning, DME instructions, Aquatic Therapy, Dry Needling, Electrical stimulation, Spinal mobilization, Cryotherapy, Moist heat, Taping, Traction, Ionotophoresis 4mg /ml Dexamethasone, and Manual therapy  PLAN FOR NEXT SESSION: T-spine assessment   8:39 AM, 02/14/22 M. 04/16/22, PT, DPT Physical Therapist- [place] Office Number: [phone]

## 2022-02-21 ENCOUNTER — Ambulatory Visit: Payer: No Typology Code available for payment source

## 2022-02-21 DIAGNOSIS — M542 Cervicalgia: Secondary | ICD-10-CM | POA: Diagnosis not present

## 2022-02-21 DIAGNOSIS — R29898 Other symptoms and signs involving the musculoskeletal system: Secondary | ICD-10-CM

## 2022-02-21 NOTE — Therapy (Signed)
OUTPATIENT PHYSICAL THERAPY NEURO TREATMENT and D/C Summary   Patient Name: Joseph Todd MRN: [id number] DOB:08-21-1955, 66 y.o., male Today's Date: 02/21/2022   PCP: None listed REFERRING PROVIDER: Stan Head, MD  PHYSICAL THERAPY DISCHARGE SUMMARY  Visits from Start of Care: 4  Current functional level related to goals / functional outcomes: Met 2/3 goals   Remaining deficits: Limited cervical rotation   Education / Equipment: HEP, equipment recommendations   Patient agrees to discharge. Patient goals were partially met. Patient is being discharged due to maximized rehab potential.     PT End of Session - 02/21/22 0800     Visit Number 4    Number of Visits 4    Date for PT Re-Evaluation 02/23/22    Authorization Type [facility]    Authorization - Visit Number 4    Authorization - Number of Visits 15    PT Start Time 0800    PT Stop Time 0845    PT Time Calculation (min) 45 min    Activity Tolerance Patient tolerated treatment well    Behavior During Therapy WFL for tasks assessed/performed             Past Medical History:  Diagnosis Date   Carpal tunnel syndrome on right    DM (diabetes mellitus) ([place])    EKG abnormalities    Neck pain    Syncope and collapse    Past Surgical History:  Procedure Laterality Date   CARPAL TUNNEL RELEASE Right    Patient Active Problem List   Diagnosis Date Noted   Hyperkalemia 06/22/2019   Pneumonia due to COVID-19 virus 06/14/2019   Acute respiratory failure due to COVID-19 ([place]) 06/14/2019   DM (diabetes mellitus) ([place]) 06/10/2019   Acute kidney injury ([place]) 06/10/2019   Syncope 06/09/2019    ONSET DATE: "some years"   REFERRING DIAG: M46.09 (ICD-10-CM) - Spinal enthesopathy, 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 Pt has f/u with PCP in 90 days Pt accompanied by: self  PERTINENT HISTORY: DM, OSA. Pt reports hx of PT for neck and back pain and hx of aquatic PT  PAIN:  Are you having pain? Yes: NPRS scale: 4/10 Pain location: neck, shoulders, center of back,  right fingers Pain description: dull in neck, sharp in shoulders, right ulnar fingers sharp Aggravating factors: lifting, turning the wrong way Relieving factors: "not much" medication dulls it down  PRECAUTIONS: None  WEIGHT BEARING RESTRICTIONS No  FALLS: Has patient fallen in last 6 months? Yes. Number of falls 1  LIVING ENVIRONMENT: Lives with: lives with their spouse Lives in: House/apartment Stairs: Yes: Internal: 12 steps; can reach both Has following equipment at home: None  PLOF: Independent  PATIENT GOALS reduce pain  OBJECTIVE:   TODAY'S TREATMENT: 02/21/22 Activity Comments  Pt education in chronic pain mgmt techniques; e.g. accupuncture, cardiovascular exercise, TENS unit   HEP review                  TODAY'S TREATMENT: 02/14/22 Activity Comments  TENS trial demonstration and review   Chin retraction 10x 3 sec hold   Wrist flexor stretch+shoulder depression+cervical rotation (like a nerve tension stretch) Palm on countertop  Single arm row 2x10 Doubled blue t-band          TODAY'S TREATMENT: 02/07/22 Activity Comments  Pt education Use of MHP and discussion regarding alternative modalities for pain control, e.g. accupuncture, at-home traction. Infrared heating pad  Wrist flexor stretch+shoulder depression+cervical rotation (like a nerve tension stretch) Palm on countertop  Chin retractions  10x 3 sec hold. Visual and tactile cues for isolation and movement  Seated Assisted Cervical Rotation with Towel  10x   Mid-Lower Cervical Extension SNAG with Strap 10x          HOME EXERCISE PROGRAM: Access Code: [email or web address] URL: [email or web address] Date: 01/26/2022 Prepared by: Sherlyn Lees  Exercises - Mid-Lower Cervical Extension SNAG with Strap  - 1 x daily - 7 x weekly - 3 sets - 10 reps - Seated Assisted Cervical Rotation with Towel  - 1 x daily - 7 x weekly - 3 sets - 10 reps - Supine Neck Traction with Doorway--only utilized title, not picture/action demonstrated. Advised pt to hang head and RUE over side of bed for self-traction. - Wrist Flexor Stretch on Table  - 1 x daily - 7 x weekly - 3 sets - 10 reps - Seated Cervical Retraction  - 1 x daily - 7 x weekly - 1-3 sets - 10 reps - 3 sec hold -- Standing Single Arm Low Row with Anchored Resistance  - 1 x daily - 7 x weekly - 3 sets - 10 reps   DIAGNOSTIC FINDINGS: MRI: degenerative disc disease at C3-4, C4-5, C5-6, C6-7, C7-T1. Patient has graded central canal stenosis at the C3-4 level from broad-based disc bulge and bilateral uncovertebral joint hypertrophy causing moderate central canal stenosis. At C4-5, C5-6, C6-7, C7-T1 there is varying levels of neuroforaminal and central cervical spinal stenosis from degenerative disc disease and uncovertebral joint hypertrophy. No evidence of cord signal abnormality.  COGNITION: Overall cognitive status: Within functional limits for tasks assessed   SENSATION: Not tested  COORDINATION: WNL  EDEMA:    MUSCLE TONE:  Intrinsic atrophy right hand  MUSCLE LENGTH:  DTRs:  DNT  POSTURE:  Decreased cervical lordosis evident  Cervical ROM: extension: 10% limited      Left rotation and sidebending 50% limited      Right rotation/sidebending 25% limited   UE MMT: RUE demo C4-T1 myotome weakness    LOWER EXTREMITY MMT:    MMT Right Eval Left Eval  Hip flexion    Hip extension    Hip abduction    Hip adduction    Hip internal  rotation    Hip external rotation    Knee  flexion    Knee extension    Ankle dorsiflexion    Ankle plantarflexion    Ankle inversion    Ankle eversion    (Blank rows = not tested)  BED MOBILITY:  independent  TRANSFERS: independent    GAIT: Gait pattern: WFL Distance walked:  Assistive device utilized: None Level of assistance: Complete Independence Comments:   FUNCTIONAL TESTs:    PATIENT SURVEYS:  None indicated  TODAY'S TREATMENT:  Evaluation, HEP initiation   PATIENT EDUCATION: Education details: condition/symptom mgmt. Benefits of increased intensity CV exercise and use of RPE for gauge Person educated: Patient Education method: Explanation Education comprehension: verbalized understanding       GOALS: Goals reviewed with patient? Yes  SHORT TERM GOALS: Target date: 02/09/2022  Patient will be independent in HEP to improve functional outcomes Baseline: Goal status: MET    LONG TERM GOALS: Target date: 02/23/2022  Demonstrate improved left cervical rotation to 10% restricted to improve comfort with driving activities Baseline: 50% limited Goal status: NOT MET  2.  Report/demonstrate self-management strategies to improve sleep comfort Baseline:  Goal status: MET    ASSESSMENT:  CLINICAL IMPRESSION: Continues to demo limited cervical rotation ROM with approx 45 degrees rotation left/right. Demonstrates and reports symptom mgmt techniques and independence with HEP.  Pt will D/C to HEP at this time   OBJECTIVE IMPAIRMENTS decreased activity tolerance, decreased ROM, decreased strength, impaired perceived functional ability, impaired UE functional use, postural dysfunction, and pain.   ACTIVITY LIMITATIONS carrying, lifting, sleeping, and locomotion level  PARTICIPATION LIMITATIONS: meal prep, cleaning, community activity, and yard work  PERSONAL FACTORS Time since onset of injury/illness/exacerbation and 1 comorbidity: DM  are also affecting patient's functional outcome.   REHAB  POTENTIAL: Fair based on time since onset, interaction of conditions  CLINICAL DECISION MAKING: Stable/uncomplicated  EVALUATION COMPLEXITY: Low  PLAN: PT FREQUENCY: 1x/week  PT DURATION: 4 weeks  PLANNED INTERVENTIONS: Therapeutic exercises, Therapeutic activity, Neuromuscular re-education, Balance training, Gait training, Patient/Family education, Self Care, Joint mobilization, Stair training, Vestibular training, Canalith repositioning, DME instructions, Aquatic Therapy, Dry Needling, Electrical stimulation, Spinal mobilization, Cryotherapy, Moist heat, Taping, Traction, Ionotophoresis 44m/ml Dexamethasone, and Manual therapy  PLAN FOR NEXT SESSION: D/C   8:01 AM, 02/21/22 M. KSherlyn Lees PT, DPT Physical Therapist- [place]Office Number: [phone]

## 2022-02-23 NOTE — Progress Notes (Unsigned)
NEUROLOGY CONSULTATION NOTE  Joseph Todd MRN: [id number] DOB: 26-Aug-1955  Referring provider: Shelly Coss, MD Primary care provider: ***  Reason for consult:  headache  Assessment/Plan:   ***   Subjective:  Joseph Todd is 66 year old male with DM II and history of right CTS s/p release who presents for headache.  History supplemented by [facility] note.  Onset:  *** Location:  *** Quality:  *** Intensity:  ***.   Aura:  *** Prodrome:  *** Postdrome:  *** Associated symptoms:  ***.  *** denies associated unilateral numbness or weakness. Duration:  *** Frequency:  *** Frequency of abortive medication: *** Triggers:  *** Relieving factors:  *** Activity:  ***  He had a CT head on 01/19/2022 which revealed severe paranasal sinus disease and mild cerebral atrophy but no acute intracranial abnormality.  Past NSAIDS/analgesics:  *** Past abortive triptans:  *** Past abortive ergotamine:  *** Past muscle relaxants:  *** Past anti-emetic:  *** Past antihypertensive medications:  *** Past antidepressant medications:  nortriptyline, duloxetine Past anticonvulsant medications:  *** Past anti-CGRP:  *** Past vitamins/Herbal/Supplements:  *** Past antihistamines/decongestants:  *** Other past therapies:  ***  Current NSAIDS/analgesics:  acetaminophen Current triptans:  *** Current ergotamine:  *** Current anti-emetic:  *** Current muscle relaxants:  *** Current Antihypertensive medications:  *** Current Antidepressant medications:  *** Current Anticonvulsant medications:  pregablin 75mg  BID (chronic pain) Current anti-CGRP:  *** Current Vitamins/Herbal/Supplements:  *** Current Antihistamines/Decongestants:  *** Other therapy:  *** Hormone/birth control:  *** Other medications:  ***   Caffeine:  *** Alcohol:  *** Smoker:  *** Diet:  *** Exercise:  *** Depression:  ***; Anxiety:  *** Other pain:  He has cervical spondylosis with chronic neck pain.  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  He continues to have residual pain from right carpal tunnel syndrome after release in 2021.   Sleep hygiene:  *** Family history of headache:  ***   02/11/2022 LABS:  Cr 1.420, t bili 0.8, AST 32, ALT 42, ANA negative   PAST MEDICAL HISTORY: Past Medical History:  Diagnosis Date   Carpal tunnel syndrome on right    DM (diabetes mellitus) ([place])    EKG abnormalities    Neck pain    Syncope and collapse     PAST SURGICAL HISTORY: Past Surgical History:  Procedure Laterality Date   CARPAL TUNNEL RELEASE Right     MEDICATIONS: Current Outpatient Medications on File Prior to Visit  Medication Sig Dispense Refill   atorvastatin (LIPITOR) 20 MG tablet TAKE ONE TABLET BY MOUTH DAILY FOR CHOLESTEROL     Capsaicin 0.1 % CREA APPLY SMALL AMOUNT TO AFFECTED AREA AT BEDTIME AS NEEDED     cetirizine (ZYRTEC) 10 MG tablet TAKE ONE TABLET BY MOUTH DAILY AS NEEDED FOR ALLERGY SYMPTOMS     clindamycin (CLEOCIN T) 1 % external solution Apply twice a day on affected areas     DULoxetine (CYMBALTA) 60 MG capsule TAKE ONE CAPSULE BY MOUTH DAILY REPLACING SERTRALINE     gabapentin (NEURONTIN) 300 MG capsule Take 1 capsule by mouth 3 (three) times daily.     glipiZIDE (GLUCOTROL) 10 MG tablet TAKE ONE TABLET BY MOUTH TWICE A DAY FOR DIABETES (TAKE THIS MEDICATION 30 MINUTES PRIOR TO A MEAL)     Ipratropium-Albuterol (COMBIVENT) 20-100 MCG/ACT AERS respimat Inhale 1 puff into the lungs every 6 (six) hours as needed for wheezing or shortness of breath.  4 g 0   metFORMIN (GLUCOPHAGE) 500 MG tablet Take 1 tablet (500 mg total) by mouth 2 (two) times daily with a meal. 60 tablet 0   Sennosides (SENNA) 8.6 MG CAPS Take 8.6 mg by mouth daily. 30 capsule 0   thiamine 100 MG tablet Take 100 mg by mouth 2 (two) times daily.     vitamin B-12 (CYANOCOBALAMIN) 500 MCG tablet Take 2 tablets by mouth daily.     Vitamin D, Ergocalciferol,  (DRISDOL) 1.25 MG (50000 UNIT) CAPS capsule Take 50,000 Units by mouth once a week.     No current facility-administered medications on file prior to visit.    ALLERGIES: No Known Allergies  FAMILY HISTORY: Family History  Problem Relation Age of Onset   Heart disease Father    Hypertension Father    Diabetes Brother    Lung disease Neg Hx     Objective:  *** General: No acute distress.  Patient appears well-groomed.   Head:  Normocephalic/atraumatic Eyes:  fundi examined but not visualized Neck: supple, no paraspinal tenderness, full range of motion Back: No paraspinal tenderness Heart: regular rate and rhythm Lungs: Clear to auscultation bilaterally. Vascular: No carotid bruits. Neurological Exam: Mental status: alert and oriented to person, place, and time, speech fluent and not dysarthric, language intact. Cranial nerves: CN I: not tested CN II: pupils equal, round and reactive to light, visual fields intact CN III, IV, VI:  full range of motion, no nystagmus, no ptosis CN V: facial sensation intact. CN VII: upper and lower face symmetric CN VIII: hearing intact CN IX, X: gag intact, uvula midline CN XI: sternocleidomastoid and trapezius muscles intact CN XII: tongue midline Bulk & Tone: normal, no fasciculations. Motor:  muscle strength 5/5 throughout Sensation:  Pinprick, temperature and vibratory sensation intact. Deep Tendon Reflexes:  2+ throughout,  toes downgoing.   Finger to nose testing:  Without dysmetria.   Heel to shin:  Without dysmetria.   Gait:  Normal station and stride.  Romberg negative.    Thank you for allowing me to take part in the care of this patient.  Shon Millet, DO  CC: ***

## 2022-02-24 ENCOUNTER — Encounter: Payer: Self-pay | Admitting: Neurology

## 2022-02-24 ENCOUNTER — Ambulatory Visit (INDEPENDENT_AMBULATORY_CARE_PROVIDER_SITE_OTHER): Payer: No Typology Code available for payment source | Admitting: Neurology

## 2022-02-24 VITALS — BP 135/83 | HR 84 | Ht 68.0 in | Wt 184.0 lb

## 2022-02-24 DIAGNOSIS — G44229 Chronic tension-type headache, not intractable: Secondary | ICD-10-CM

## 2022-02-24 MED ORDER — NORTRIPTYLINE HCL 10 MG PO CAPS: 10.0000 mg | ORAL_CAPSULE | Freq: Every day | ORAL | 5 refills | Status: DC

## 2022-02-24 NOTE — Patient Instructions (Signed)
Start nortriptyline 10mg  at bedtime.  If no improvement in 4 weeks, contact me and I will increase dose Limit use of pain relievers (acetaminophen, ibuprofen, Excedrin, etc) to no more than 2 days out of week to prevent risk of rebound or medication-overuse headache. Follow up in 4-5 months.````

## 2022-04-06 ENCOUNTER — Other Ambulatory Visit: Payer: Self-pay

## 2022-04-06 DIAGNOSIS — M25561 Pain in right knee: Secondary | ICD-10-CM

## 2022-05-05 ENCOUNTER — Ambulatory Visit
Admission: RE | Admit: 2022-05-05 | Discharge: 2022-05-05 | Disposition: A | Discharge status: Home / Self Care | Payer: Disability Insurance | Source: Ambulatory Visit

## 2022-05-05 ENCOUNTER — Ambulatory Visit
Admission: RE | Admit: 2022-05-05 | Discharge: 2022-05-05 | Disposition: A | Discharge status: Home / Self Care | Payer: Disability Insurance | Attending: Family Medicine | Admitting: Family Medicine

## 2022-05-05 DIAGNOSIS — M170 Bilateral primary osteoarthritis of knee: Secondary | ICD-10-CM | POA: Insufficient documentation

## 2022-05-05 DIAGNOSIS — M25561 Pain in right knee: Secondary | ICD-10-CM | POA: Diagnosis not present

## 2022-05-05 DIAGNOSIS — M11262 Other chondrocalcinosis, left knee: Secondary | ICD-10-CM | POA: Diagnosis not present

## 2022-05-05 DIAGNOSIS — M25562 Pain in left knee: Secondary | ICD-10-CM | POA: Diagnosis present

## 2022-05-20 ENCOUNTER — Encounter: Payer: Self-pay | Admitting: Family Medicine

## 2022-07-25 NOTE — Progress Notes (Unsigned)
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   Nortriptyline *** Limit use of pain relievers to no more than 2 days out of week to prevent risk of rebound or medication-overuse headache. Should address CPAP settings as well Follow up 4-5 months. ***     Subjective:  Joseph Todd is 67 year old male with DM II, chronic pain and history of right CTS s/p release who follows up for headache.  UPDATE: Started nortriptyline.  ***  Current NSAIDS/analgesics:  acetaminophen Current antidepressant:  nortriptyline 25mg  QHS *** Current Anticonvulsant medications:  gabapentin 300mg  TID (chronic pain), pregablin 75mg  BID (chronic pain) Current Antihistamines/Decongestants:  Zyrtec   HISTORY: Patient has history of chronic pain.  In July 2023, he had a flare up of pain that caused him to lose his balance and he fell on his face, knocking out some of his teeth.  Since then, he has had a daily headache.  He describes a 3-4/10 left frontal non-throbbing headache with associated sharp left orbital pain.  Denies visual disturbance, nausea, vomiting, photophobia, phonophobia.  Lasts 15-30 minutes and occurs twice a day.  Treats with acetaminophen twice a day.  Denies prior history of headaches.  Has since been evaluated by ophthalmology - no concerns.   He had a CT head on 01/19/2022 which revealed severe paranasal sinus disease and mild cerebral atrophy but no acute intracranial abnormality.  Denies congestion or rhinorrhea.     Other pain:  He has cervical spondylosis with chronic neck pain.  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  He continues to have residual pain from right carpal tunnel syndrome after release in 2021.   Sleep hygiene:  Pain often keeps him up.  He has OSA but states CPAP not working right.  ANA from October 2023  was negative  PAST MEDICAL HISTORY: Past Medical History:  Diagnosis Date   Carpal tunnel syndrome on right    DM (diabetes mellitus) ([place])    EKG abnormalities    Neck pain    Syncope and collapse     MEDICATIONS: Current Outpatient Medications on File Prior to Visit  Medication Sig Dispense Refill   atorvastatin (LIPITOR) 20 MG tablet TAKE ONE TABLET BY MOUTH DAILY FOR CHOLESTEROL     Capsaicin 0.1 % CREA APPLY SMALL AMOUNT TO AFFECTED AREA AT BEDTIME AS NEEDED     cetirizine (ZYRTEC) 10 MG tablet TAKE ONE TABLET BY MOUTH DAILY AS NEEDED FOR ALLERGY SYMPTOMS     clindamycin (CLEOCIN T) 1 % external solution Apply twice a day on affected areas     gabapentin (NEURONTIN) 300 MG capsule Take 1 capsule by mouth 3 (three) times daily.     glipiZIDE (GLUCOTROL) 10 MG tablet TAKE ONE TABLET BY MOUTH TWICE A DAY FOR DIABETES (TAKE THIS MEDICATION 30 MINUTES PRIOR TO A MEAL)     Ipratropium-Albuterol (COMBIVENT) 20-100 MCG/ACT AERS respimat Inhale 1 puff into the lungs every 6 (six) hours as needed for wheezing or shortness of breath. 4 g 0   metFORMIN (GLUCOPHAGE) 500 MG tablet Take 1 tablet (500 mg total) by mouth 2 (two) times daily with a meal. 60 tablet 0   nortriptyline (PAMELOR) 10 MG capsule Take 1 capsule (10 mg total) by mouth at bedtime. 30 capsule 5   pregabalin (LYRICA) 75 MG capsule TAKE ONE CAPSULE BY MOUTH TWICE A DAY FOR CHRONIC  PAIN.     Sennosides (SENNA) 8.6 MG CAPS Take 8.6 mg by mouth daily. 30 capsule 0   No current facility-administered medications on file prior to visit.    ALLERGIES: No Known Allergies  FAMILY HISTORY: Family History  Problem Relation Age of Onset   Heart disease Father    Hypertension Father    Diabetes Brother    Lung disease Neg Hx       Objective:  *** General: No acute distress.  Patient appears well-groomed.   Head:  Normocephalic/atraumatic Eyes:  Fundi examined but not visualized Neck: supple, no paraspinal tenderness, full  range of motion Heart:  Regular rate and rhythm Neurological Exam: ***   Metta Clines, DO  CC: [facility]

## 2022-07-27 ENCOUNTER — Encounter: Payer: Self-pay | Admitting: Neurology

## 2022-07-27 ENCOUNTER — Ambulatory Visit (INDEPENDENT_AMBULATORY_CARE_PROVIDER_SITE_OTHER): Payer: No Typology Code available for payment source | Admitting: Neurology

## 2022-07-27 VITALS — BP 170/90 | HR 98 | Ht 67.0 in | Wt 181.0 lb

## 2022-07-27 DIAGNOSIS — G44229 Chronic tension-type headache, not intractable: Secondary | ICD-10-CM | POA: Diagnosis not present

## 2022-07-27 DIAGNOSIS — I10 Essential (primary) hypertension: Secondary | ICD-10-CM | POA: Diagnosis not present

## 2022-07-27 MED ORDER — NORTRIPTYLINE HCL 25 MG PO CAPS: 25.0000 mg | ORAL_CAPSULE | Freq: Every day | ORAL | 5 refills | Status: DC

## 2022-07-27 NOTE — Patient Instructions (Addendum)
Increase nortriptyline to 25mg  at bedtime.  If no significant improvement in 2 months, contact me and we can still increase dose Limit use of pain relievers to no more than 2 days out of week to prevent risk of rebound or medication-overuse headache. Keep headache diary Follow up in 4-5 months.

## 2022-08-24 ENCOUNTER — Encounter: Payer: Self-pay | Admitting: Internal Medicine

## 2022-09-01 ENCOUNTER — Institutional Professional Consult (permissible substitution): Payer: No Typology Code available for payment source | Admitting: Student

## 2022-09-12 ENCOUNTER — Ambulatory Visit: Payer: No Typology Code available for payment source | Admitting: Internal Medicine

## 2022-09-12 ENCOUNTER — Encounter: Payer: Self-pay | Admitting: Internal Medicine

## 2022-09-12 VITALS — BP 120/80 | HR 87 | Temp 97.8°F | Ht 67.0 in | Wt 181.0 lb

## 2022-09-12 DIAGNOSIS — U071 COVID-19: Secondary | ICD-10-CM | POA: Diagnosis not present

## 2022-09-12 DIAGNOSIS — J8410 Pulmonary fibrosis, unspecified: Secondary | ICD-10-CM

## 2022-09-12 NOTE — Progress Notes (Signed)
Joseph Todd    [id number]    12-11-1955  Primary Care Physician:[facility] Date of Appointment: 09/12/2022 Established Patient Visit  Chief complaint:   Chief Complaint  Patient presents with   Consult    Pt is coming to see [place] due to SOB. States he has been having problems with it since being diagnosed with Covid 2 years ago and states he can have the SOB at anytime.     HPI: Joseph Todd is a 67 y.o. gentleman with COVID 19 infection 2021 with resultant pulmonary fibrosis.  Interval Updates: Here for annual follow up. Still having dyspnea on exertion. Does not appear to be worse than last year.   Still independent with ADLS. Spending time with his grandchildren (9 and 63.) He has lost 10 lbs intentionally.   No interval hospitalizations for breathing.   No cough, wheezing, chest tightness.   I have reviewed the patient's family social and past medical history and updated as appropriate.   Past Medical History:  Diagnosis Date   Carpal tunnel syndrome on right    DM (diabetes mellitus) ([facility])    EKG abnormalities    Neck pain    Syncope and collapse     Past Surgical History:  Procedure Laterality Date   CARPAL TUNNEL RELEASE Right     Family History  Problem Relation Age of Onset   Heart disease Father    Hypertension Father    Diabetes Brother    Lung disease Neg Hx     Social History   Occupational History   Not on file  Tobacco Use   Smoking status: Former    Packs/day: .25    Types: Cigarettes    Quit date: 1990    Years since quitting: 34.3   Smokeless tobacco: Never   Tobacco comments:    at the most 4 a day.  Substance and Sexual Activity   Alcohol use: Yes    Alcohol/week: 3.0 standard drinks of alcohol    Types: 3 Cans of beer per week    Comment: 2-3 times a week   Drug use: Never   Sexual activity: Not on file     Physical Exam: Blood pressure 120/80, pulse 87, temperature 97.8 F (36.6 C), temperature source  Oral, height 5\' 7"  (1.702 m), weight 181 lb (82.1 kg), SpO2 97 %.  Gen:      No distress Lungs:    ctab no wheezes or crackles, no increased work or breathing CV:       RRR, no mrg   Data Reviewed: Imaging: I have personally reviewed the CT Chest from May 2022 and compared it to Feb 2022. There is dramatic improvement in multifocal airspace opacities. The CT Chest in May has some fine fibrotic changes in both bases as well as some paraseptal emphysema  PFTs:     Latest Ref Rng & Units 10/08/2020   11:58 AM  PFT Results  FVC-Pre L 2.39   FVC-Predicted Pre % 65   FVC-Post L 2.31   FVC-Predicted Post % 63   Pre FEV1/FVC % % 89   Post FEV1/FCV % % 90   FEV1-Pre L 2.13   FEV1-Predicted Pre % 75   FEV1-Post L 2.08   DLCO uncorrected ml/min/mmHg 18.50   DLCO UNC% % 72   DLCO corrected ml/min/mmHg 18.50   DLCO COR %Predicted % 72   DLVA Predicted % 128   TLC L 3.71   TLC %  Predicted % 56   RV % Predicted % 51    Mild restriction to ventilation, reduced dlco  Spirometry repeated 10/05/21 shows stable FVC  Labs:  Immunization status: Immunization History  Administered Date(s) Administered   Influenza Split 01/13/2016, 02/06/2017   Influenza,inj,Quad PF,6+ Mos 08/14/2020, 02/12/2021   Influenza-Unspecified 06/05/2013   Moderna Covid-19 Vaccine Bivalent Booster 8yrs & up 02/19/2021   Moderna Sars-Covid-2 Vaccination 09/18/2019, 10/08/2019, 04/23/2020   Pneumococcal-Unspecified 05/09/2005   Tdap 11/17/2012    Assessment:  COVID 19 infection Jan 2022 Pulmonary Fibrosis following Covid, mild restriction to ventilation Mild Emphysema History of tobacco use  Plan/Recommendations: Spirometry from 2022 - 2023 was stable (there was a slight decrease in fvc that was not significant.) Will proceed with annual PFT - will schedule today No crackles on exam, SpO2 is wnl.  Consider ct chest if drop in fvc.  Consider anti-fibrotic therapy if worsening pulmonary fibrosis. I will  contact him with PFT results and next steps.    Return to Care: Return in about 1 year (around 09/12/2023) for next available PFT, follow up after.   Durel Salts, MD Pulmonary and Critical Care Medicine [facility] Office:[phone]

## 2022-09-12 NOTE — Patient Instructions (Signed)
Please schedule follow up scheduled with myself in 1 year.  If my schedule is not open yet, we will contact you with a reminder closer to that time. Please call [phone] if you haven't heard from [place] a month before.   Before your next visit I would like you to have: Full set of PFTs - one hour  I will call you with the results. If the lung function is stable, we can follow up in one year.  If the lung function looks worse, we should follow up sooner and discuss potentially getting a CT scan to see if there has been a change in the scarring in your lungs that was related to your previous covid infection. Sometimes anti-fibrotic medications can be helpful (examples: ofev and esbriet.)

## 2022-12-27 NOTE — Progress Notes (Unsigned)
NEUROLOGY FOLLOW UP OFFICE NOTE  Joseph Todd [id number]  Assessment/Plan:   1  Chronic tension-type/post-traumatic headache *** 2  Hypertension  Nortriptyline 25mg  at bedtime *** Limit use of pain relievers to no more than 2 days out of week to prevent risk of rebound or medication-overuse headache. Follow up with PCP regarding blood pressure *** Follow up 6 months ***     Subjective:  Joseph Todd is 67 year old male with DM II, chronic pain and history of right CTS s/p release who follows up for headache.  UPDATE: Increased nortriptyline to 25mg .  ***  Current NSAIDS/analgesics:  acetaminophen (3-4 days a week for knee pain) Current antidepressant:  nortriptyline 25mg  QHS  Current Anticonvulsant medications:  gabapentin 300mg  TID (chronic pain), pregablin 75mg  BID (chronic pain) Current Antihistamines/Decongestants:  Zyrtec   HISTORY: Patient has history of chronic pain.  In July 2023, he had a flare up of pain that caused him to lose his balance and he fell on his face, knocking out some of his teeth.  Since then, he has had a daily headache.  He describes a 3-4/10 left frontal non-throbbing headache with associated sharp left orbital pain.  Denies visual disturbance, nausea, vomiting, photophobia, phonophobia.  Lasts 15-30 minutes and occurs twice a day.  Treats with acetaminophen twice a day.  Denies prior history of headaches.  Has since been evaluated by ophthalmology - no concerns.   He had a CT head on 01/19/2022 which revealed severe paranasal sinus disease and mild cerebral atrophy but no acute intracranial abnormality.  Denies congestion or rhinorrhea.     Other pain:  He has cervical spondylosis with chronic neck pain.  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  He continues to have residual pain from right carpal tunnel syndrome after release in 2021.   Sleep hygiene:  Pain often keeps him up.  He has OSA but states  CPAP not working right.  ANA from October 2023 was negative  PAST MEDICAL HISTORY: Past Medical History:  Diagnosis Date   Carpal tunnel syndrome on right    DM (diabetes mellitus) ([facility])    EKG abnormalities    Neck pain    Syncope and collapse     MEDICATIONS: Current Outpatient Medications on File Prior to Visit  Medication Sig Dispense Refill   atorvastatin (LIPITOR) 20 MG tablet TAKE ONE TABLET BY MOUTH DAILY FOR CHOLESTEROL     Capsaicin 0.1 % CREA APPLY SMALL AMOUNT TO AFFECTED AREA AT BEDTIME AS NEEDED     cetirizine (ZYRTEC) 10 MG tablet TAKE ONE TABLET BY MOUTH DAILY AS NEEDED FOR ALLERGY SYMPTOMS     gabapentin (NEURONTIN) 300 MG capsule Take 1 capsule by mouth 3 (three) times daily.     glipiZIDE (GLUCOTROL) 10 MG tablet TAKE ONE TABLET BY MOUTH TWICE A DAY FOR DIABETES (TAKE THIS MEDICATION 30 MINUTES PRIOR TO A MEAL)     Ipratropium-Albuterol (COMBIVENT) 20-100 MCG/ACT AERS respimat Inhale 1 puff into the lungs every 6 (six) hours as needed for wheezing or shortness of breath. 4 g 0   metformin (FORTAMET) 500 MG (OSM) 24 hr tablet Take by mouth.     metFORMIN (GLUCOPHAGE) 500 MG tablet Take 1 tablet (500 mg total) by mouth 2 (two) times daily with a meal. 60 tablet 0   nortriptyline (PAMELOR) 25 MG capsule Take 1 capsule (25 mg total) by mouth at bedtime. 30 capsule 5   pregabalin (LYRICA) 75 MG capsule  TAKE ONE CAPSULE BY MOUTH TWICE A DAY FOR CHRONIC PAIN.     Sennosides (SENNA) 8.6 MG CAPS Take 8.6 mg by mouth daily. 30 capsule 0   traMADol (ULTRAM) 50 MG tablet Take by mouth.     No current facility-administered medications on file prior to visit.    ALLERGIES: Allergies  Allergen Reactions   Duloxetine Other (See Comments)    FAMILY HISTORY: Family History  Problem Relation Age of Onset   Heart disease Father    Hypertension Father    Diabetes Brother    Lung disease Neg Hx       Objective:  *** General: No acute distress.  Patient appears  well-groomed.   Head:  Normocephalic/atraumatic Neck:  Supple.  No paraspinal tenderness.  Full range of motion. Heart:  Regular rate and rhythm. Neuro:  Alert and oriented.  Speech fluent and not dysarthric.  Language intact.  CN II-XII intact.  Bulk and tone normal.  Muscle strength 5/5 throughout.  Deep tendon reflexes 2+ throughout.  Gait normal.  Romberg negative.    Shon Millet, DO  CC: [facility]

## 2022-12-28 ENCOUNTER — Ambulatory Visit (INDEPENDENT_AMBULATORY_CARE_PROVIDER_SITE_OTHER): Payer: No Typology Code available for payment source | Admitting: Neurology

## 2022-12-28 ENCOUNTER — Encounter: Payer: Self-pay | Admitting: Neurology

## 2022-12-28 VITALS — BP 130/84 | HR 98 | Ht 68.0 in | Wt 179.4 lb

## 2022-12-28 DIAGNOSIS — R519 Headache, unspecified: Secondary | ICD-10-CM

## 2022-12-28 DIAGNOSIS — G8929 Other chronic pain: Secondary | ICD-10-CM | POA: Diagnosis not present

## 2022-12-28 DIAGNOSIS — Z79899 Other long term (current) drug therapy: Secondary | ICD-10-CM

## 2022-12-28 MED ORDER — TOPIRAMATE 25 MG PO TABS: 25.0000 mg | ORAL_TABLET | Freq: Every day | ORAL | 5 refills | Status: AC

## 2022-12-28 NOTE — Patient Instructions (Signed)
Stop nortriptyline.  Start topiramate 25mg  at bedtime.  If no improvement in headaches in 4 weeks, contact me and we can increase dose Check CTA of head Limit use of pain relievers to no more than 2 days out of week to prevent risk of rebound or medication-overuse headache. Keep headache diary Follow up 6 months.

## 2022-12-30 ENCOUNTER — Telehealth: Payer: Self-pay

## 2022-12-30 NOTE — Telephone Encounter (Signed)
[facility] 07[facility]27/22-01[facility]23/23. [facility].   Patient will need to renew referral for 06/2023 visit.

## 2023-01-03 ENCOUNTER — Ambulatory Visit
Admission: RE | Admit: 2023-01-03 | Discharge: 2023-01-03 | Disposition: A | Discharge status: Home / Self Care | Payer: No Typology Code available for payment source | Source: Ambulatory Visit | Attending: Neurology | Admitting: Neurology

## 2023-01-03 DIAGNOSIS — R519 Headache, unspecified: Secondary | ICD-10-CM

## 2023-01-03 MED ORDER — IOPAMIDOL (ISOVUE-370) INJECTION 76%
75.0000 mL | Freq: Once | INTRAVENOUS | Status: AC | PRN
  Administered 2023-01-03: 75 mL via INTRAVENOUS

## 2023-01-11 ENCOUNTER — Encounter (HOSPITAL_BASED_OUTPATIENT_CLINIC_OR_DEPARTMENT_OTHER): Payer: Self-pay

## 2023-01-11 ENCOUNTER — Emergency Department (HOSPITAL_BASED_OUTPATIENT_CLINIC_OR_DEPARTMENT_OTHER)
Admission: EM | Admit: 2023-01-11 | Discharge: 2023-01-11 | Disposition: A | Discharge status: Home / Self Care | Payer: No Typology Code available for payment source | Attending: Emergency Medicine | Admitting: Emergency Medicine

## 2023-01-11 ENCOUNTER — Other Ambulatory Visit: Payer: Self-pay

## 2023-01-11 ENCOUNTER — Emergency Department (HOSPITAL_BASED_OUTPATIENT_CLINIC_OR_DEPARTMENT_OTHER): Payer: No Typology Code available for payment source

## 2023-01-11 DIAGNOSIS — N289 Disorder of kidney and ureter, unspecified: Secondary | ICD-10-CM

## 2023-01-11 DIAGNOSIS — Z23 Encounter for immunization: Secondary | ICD-10-CM | POA: Diagnosis not present

## 2023-01-11 DIAGNOSIS — W010XXA Fall on same level from slipping, tripping and stumbling without subsequent striking against object, initial encounter: Secondary | ICD-10-CM | POA: Insufficient documentation

## 2023-01-11 DIAGNOSIS — R55 Syncope and collapse: Secondary | ICD-10-CM | POA: Diagnosis not present

## 2023-01-11 DIAGNOSIS — S0181XA Laceration without foreign body of other part of head, initial encounter: Secondary | ICD-10-CM | POA: Diagnosis present

## 2023-01-11 LAB — BASIC METABOLIC PANEL
Anion gap: 11 (ref 5–15)
BUN: 14 mg/dL (ref 8–23)
CO2: 28 mmol/L (ref 22–32)
Calcium: 9.7 mg/dL (ref 8.9–10.3)
Chloride: 98 mmol/L (ref 98–111)
Creatinine, Ser: 1.31 mg/dL — ABNORMAL HIGH (ref 0.61–1.24)
GFR, Estimated: >60 mL/min (ref >60)
Glucose, Bld: 240 mg/dL — ABNORMAL HIGH (ref 70–99)
Potassium: 4.2 mmol/L (ref 3.5–5.1)
Sodium: 137 mmol/L (ref 135–145)

## 2023-01-11 LAB — TROPONIN I (HIGH SENSITIVITY)
Troponin I (High Sensitivity): 3 ng/L (ref <18)
Troponin I (High Sensitivity): 3 ng/L (ref <18)

## 2023-01-11 LAB — CBC
HCT: 46.7 % (ref 39.0–52.0)
Hemoglobin: 15.6 g/dL (ref 13.0–17.0)
MCH: 30.6 pg (ref 26.0–34.0)
MCHC: 33.4 g/dL (ref 30.0–36.0)
MCV: 91.6 fL (ref 80.0–100.0)
Platelets: 268 10*3/uL (ref 150–400)
RBC: 5.1 MIL/uL (ref 4.22–5.81)
RDW: 12.9 % (ref 11.5–15.5)
WBC: 8.9 10*3/uL (ref 4.0–10.5)
nRBC: 0 % (ref 0.0–0.2)

## 2023-01-11 LAB — URINALYSIS, ROUTINE W REFLEX MICROSCOPIC
Bacteria, UA: NONE SEEN
Bilirubin Urine: NEGATIVE
Glucose, UA: >1000 mg/dL — AB
Hgb urine dipstick: NEGATIVE
Ketones, ur: NEGATIVE mg/dL
Leukocytes,Ua: NEGATIVE
Nitrite: NEGATIVE
Protein, ur: NEGATIVE mg/dL
Specific Gravity, Urine: 1.013 (ref 1.005–1.030)
pH: 6 (ref 5.0–8.0)

## 2023-01-11 LAB — CBG MONITORING, ED: Glucose-Capillary: 228 mg/dL — ABNORMAL HIGH (ref 70–99)

## 2023-01-11 MED ORDER — LIDOCAINE-EPINEPHRINE (PF) 2 %-1:200000 IJ SOLN
10.0000 mL | Freq: Once | INTRAMUSCULAR | Status: AC
  Administered 2023-01-11: 10 mL
  Filled 2023-01-11: qty 20

## 2023-01-11 MED ORDER — TETANUS-DIPHTH-ACELL PERTUSSIS 5-2.5-18.5 LF-MCG/0.5 IM SUSY
0.5000 mL | PREFILLED_SYRINGE | Freq: Once | INTRAMUSCULAR | Status: AC
  Administered 2023-01-11: 0.5 mL via INTRAMUSCULAR
  Filled 2023-01-11: qty 0.5

## 2023-01-11 NOTE — ED Notes (Signed)
Patient transported back to CT for an additional scan

## 2023-01-11 NOTE — ED Triage Notes (Addendum)
Pt states he was sitting on commode and became hot and sweaty and passed out falling and hitting his head on tile floor.   Laceration to left brow and left side of upper lip.   Pt states has nerve damage in back and c/o pain in his upper shoulders, upper back, neck and both shoulders Pt did take Lyrica before he came

## 2023-01-11 NOTE — ED Notes (Signed)
Patient transported to CT 

## 2023-01-11 NOTE — Discharge Instructions (Signed)
Your stitches will dissolve.  Return if you are having any problems.

## 2023-01-11 NOTE — ED Provider Notes (Signed)
[facility] AT [facility] Provider Note   CSN: [id number] Arrival date & time: 01/11/23  0151     History  Chief Complaint  Patient presents with   Loss of Consciousness    Joseph Todd is a 67 y.o. male.  The history is provided by the patient.  Loss of Consciousness He has history of diabetes, hyperlipidemia and comes in following a syncopal episode.  He was straining a bowel movement when he started to feel hot and lightheaded and passed out.  He did hit his head and suffered a laceration above his left eye.  There was associated nausea, diaphoresis.  He denies chest pain, heaviness, tightness, pressure.  He is complaining of pain in his neck but denies any weakness, numbness, tingling.  He does not know when his last tetanus immunization was.  He had a similar episode once in the past and was admitted to the hospital where he was diagnosed with a vasovagal episode.    Home Medications Prior to Admission medications   Medication Sig Start Date End Date Taking? Authorizing Provider  atorvastatin (LIPITOR) 20 MG tablet TAKE ONE TABLET BY MOUTH DAILY FOR CHOLESTEROL 09/10/19   [provider]  Capsaicin 0.1 % CREA APPLY SMALL AMOUNT TO AFFECTED AREA AT BEDTIME AS NEEDED 03/24/20   [provider]  escitalopram (LEXAPRO) 10 MG tablet Take 10 mg by mouth daily.  TAKE ONE-HALF TABLET BY MOUTH IN THE MORNING FOR 7 DAYS, THEN TAKE ONE TABLET IN THE MORNING FOR DEPRESSION and ANXIETY FOR DEPRESSION and ANXIETY 09/09/22   [provider]  gabapentin (NEURONTIN) 300 MG capsule Take 1 capsule by mouth 3 (three) times daily. 08/14/20   [provider]  Ipratropium-Albuterol (COMBIVENT) 20-100 MCG/ACT AERS respimat Inhale 1 puff into the lungs every 6 (six) hours as needed for wheezing or shortness of breath. 06/21/19   Arrien, York Ram, MD  metformin (FORTAMET) 500 MG (OSM) 24 hr tablet Take by mouth. 09/08/21   [provider]  metFORMIN (GLUCOPHAGE) 500 MG tablet Take 1 tablet (500 mg total) by mouth 2 (two) times daily with a meal. 06/25/19 12/28/22  Arrien, York Ram, MD  pregabalin (LYRICA) 150 MG capsule Take 150 mg by mouth 2 (two) times daily. 08/18/22   [provider]  pregabalin (LYRICA) 75 MG capsule TAKE ONE CAPSULE BY MOUTH TWICE A DAY FOR CHRONIC PAIN. 08/13/21   [provider]  Sennosides (SENNA) 8.6 MG CAPS Take 8.6 mg by mouth daily. 06/10/19   Dellia Cloud, MD  topiramate (TOPAMAX) 25 MG tablet Take 1 tablet (25 mg total) by mouth at bedtime. 12/28/22   Drema Dallas, DO  traMADol (ULTRAM) 50 MG tablet Take by mouth. 12/12/19   [provider]      Allergies    Duloxetine    Review of Systems   Review of Systems  Cardiovascular:  Positive for syncope.  All other systems reviewed and are negative.   Physical Exam Updated Vital Signs BP 134/78 (BP Location: Left Arm)   Pulse 99   Temp 98 F (36.7 C) (Oral)   Resp 18   Ht 5\' 8"  (1.727 m)   Wt 81.2 kg   SpO2 100%   BMI 27.22 kg/m  Physical Exam Vitals and nursing note reviewed.   67 year old male, resting comfortably and in no acute distress. Vital signs are normal. Oxygen saturation is 100%, which is normal. Head is normocephalic.  Laceration present above the left  eyebrow extending into the forehead. PERRLA, EOMI. Oropharynx is clear. Neck is mildly tender diffusely in the midline.. Back is nontender and there is no CVA tenderness. Lungs are clear without rales, wheezes, or rhonchi. Chest is nontender. Heart has regular rate and rhythm without murmur. Abdomen is soft, flat, nontender. Extremities have no cyanosis or edema, full range of motion is present. Skin is warm and dry without rash. Neurologic: Mental status is normal, cranial nerves are intact, moves all extremities equally.     ED Results / Procedures / Treatments   Labs (all labs ordered are listed, but only abnormal results are  displayed) Labs Reviewed  BASIC METABOLIC PANEL - Abnormal; Notable for the following components:      Result Value   Glucose, Bld 240 (*)    Creatinine, Ser 1.31 (*)    All other components within normal limits  URINALYSIS, ROUTINE W REFLEX MICROSCOPIC - Abnormal; Notable for the following components:   Glucose, UA >1,000 (*)    All other components within normal limits  CBG MONITORING, ED - Abnormal; Notable for the following components:   Glucose-Capillary 228 (*)    All other components within normal limits  CBC  TROPONIN I (HIGH SENSITIVITY)  TROPONIN I (HIGH SENSITIVITY)    EKG EKG Interpretation Date/Time:  Wednesday January 11 2023 02:17:46 EDT Ventricular Rate:  93 PR Interval:  181 QRS Duration:  89 QT Interval:  313 QTC Calculation: 390 R Axis:   66  Text Interpretation: Sinus rhythm Normal ECG When compared with ECG of 06/22/2019, No significant change was found Confirmed by Dione Booze ([id number]) on 01/11/2023 2:23:46 AM  Radiology CT HEAD WO CONTRAST  Result Date: 01/11/2023 CLINICAL DATA:  Head trauma, minor (Age >= 65y) syncopal episdode hitting his head EXAM: CT HEAD WITHOUT CONTRAST TECHNIQUE: Contiguous axial images were obtained from the base of the skull through the vertex without intravenous contrast. RADIATION DOSE REDUCTION: This exam was performed according to the departmental dose-optimization program which includes automated exposure control, adjustment of the mA and/or kV according to patient size and/or use of iterative reconstruction technique. COMPARISON:  CT head 06/09/2019 FINDINGS: Brain: No evidence of large-territorial acute infarction. No parenchymal hemorrhage. No mass lesion. No extra-axial collection. No mass effect or midline shift. No hydrocephalus. Basilar cisterns are patent. Vascular: No hyperdense vessel. Atherosclerotic calcifications are present within the cavernous internal carotid arteries. Skull: No acute fracture or focal lesion.  Sinuses/Orbits: Right frontal and bilateral ethmoid sinus mucosal thickening. Paranasal sinuses and mastoid air cells are clear. The orbits are unremarkable. Other: None. IMPRESSION: 1. No acute intracranial abnormality. 2. Please see separately dictated CT angiography head 01/11/2023. Electronically Signed   By: Tish Frederickson M.D.   On: 01/11/2023 02:34    Procedures .Marland KitchenLaceration Repair  Date/Time: 01/11/2023 4:51 AM  Performed by: Dione Booze, MD Authorized by: Dione Booze, MD   Consent:    Consent obtained:  Verbal   Consent given by:  Patient   Risks, benefits, and alternatives were discussed: yes     Risks discussed:  Infection, pain and poor cosmetic result   Alternatives discussed:  No treatment Universal protocol:    Procedure explained and questions answered to patient or proxy's satisfaction: yes     Relevant documents present and verified: yes     Test results available: yes     Imaging studies available: yes     Required blood products, implants, devices, and special equipment available: yes     Site/side marked: yes  Immediately prior to procedure, a time out was called: yes     Patient identity confirmed:  Verbally with patient and arm band Anesthesia:    Anesthesia method:  Local infiltration   Local anesthetic:  Lidocaine 2% WITH epi Laceration details:    Location:  Face   Face location:  Forehead   Length (cm):  4   Depth (mm):  3 Pre-procedure details:    Preparation:  Patient was prepped and draped in usual sterile fashion and imaging obtained to evaluate for foreign bodies Exploration:    Limited defect created (wound extended): no     Hemostasis achieved with:  Direct pressure   Imaging obtained: x-ray     Imaging outcome: foreign body not noted     Wound exploration: entire depth of wound visualized     Wound extent: no foreign body, no signs of injury and no underlying fracture     Contaminated: no   Treatment:    Area cleansed with:  Saline    Amount of cleaning:  Standard Skin repair:    Repair method:  Sutures   Suture size:  5-0   Wound skin closure material used: Vicryl Rapide.   Suture technique:  Simple interrupted   Number of sutures:  8 Approximation:    Approximation:  Close Repair type:    Repair type:  Simple Post-procedure details:    Dressing:  Antibiotic ointment and adhesive bandage   Procedure completion:  Tolerated well, no immediate complications   Cardiac monitor shows normal sinus rhythm, per my interpretation.  Medications Ordered in ED Medications  lidocaine-EPINEPHrine (XYLOCAINE W/EPI) 2 %-1:200000 (PF) injection 10 mL (has no administration in time range)  Tdap (BOOSTRIX) injection 0.5 mL (has no administration in time range)    ED Course/ Medical Decision Making/ A&P                                 Medical Decision Making Amount and/or Complexity of Data Reviewed Labs: ordered. Radiology: ordered.  Risk Prescription drug management.   Syncopal episode which does seem to be vagal as it occurred while straining at a bowel movement.  No palpitations, doubt arrhythmia.  I have reviewed his past records, and he was admitted 06/09/2019-06/10/2019 with diagnosis of situational syncope.  CT scan of the head shows no acute intracranial process.  Have independently viewed the images, and agree with radiologist interpretation.  However, with complaint of neck pain I have added CT of the cervical spine.  I reviewed and interpreted his electrocardiogram, and my interpretation is normal ECG.  Of note, he did have CT angiogram of the head on 01/03/2023 but there is no report available for this study.  Last tetanus was on 11/17/2012, I have ordered Tdap booster.  CT scans showed no acute intracranial abnormality, no acute bony abnormality of the cervical spine.  Have independently viewed the images, and agree with radiologist's interpretation.  I reviewed his laboratory tests and my interpretation is mild renal  insufficiency which is new compared with 06/25/2019 but no indication this is anything more than gradual progression of intrinsic renal disease, normal CBC, normal initial troponin with repeat troponin pending.  I have closed his laceration with sutures.  Repeat troponin is unchanged.  He is safe for discharge.  Final Clinical Impression(s) / ED Diagnoses Final diagnoses:  Vasovagal syncope  Forehead laceration, initial encounter  Renal insufficiency    Rx / DC Orders  ED Discharge Orders     None         Dione Booze, MD 01/11/23 [phone]

## 2023-01-26 NOTE — Progress Notes (Signed)
Patient advised.

## 2023-02-03 ENCOUNTER — Other Ambulatory Visit: Payer: Self-pay

## 2023-02-03 DIAGNOSIS — J8410 Pulmonary fibrosis, unspecified: Secondary | ICD-10-CM

## 2023-02-03 DIAGNOSIS — U071 COVID-19: Secondary | ICD-10-CM

## 2023-05-11 ENCOUNTER — Telehealth: Payer: Self-pay

## 2023-05-11 NOTE — Telephone Encounter (Signed)
 [facility] Due. Form filled out and faxed over with last ov notes.

## 2023-06-30 NOTE — Progress Notes (Deleted)
 NEUROLOGY FOLLOW UP OFFICE NOTE  Joseph Todd [id number]  Assessment/Plan:   Chronic left orbital/left sided headache, likely post-traumatic headache but since it is a focal stabbing headache, will rule out intracranial vascular abnormality   Would not want to increase nortriptyline as he has been having elevated blood pressure over past several months.  Discontinue nortriptyline.  Start topiramate 25mg  at bedtime.  We can increase to 50mg  at bedtime in 4 weeks if needed. Limit use of pain relievers to no more than 2 days out of week to prevent risk of rebound or medication-overuse headache. Follow up 6 months      Subjective:  Joseph Todd is 68 year old male with DM II, chronic pain and history of right CTS s/p release who follows up for headache.  UPDATE: Due to elevated blood pressure, changed from nortriptyline to topiramate. They last 6-15 minutes (longest 30-45 minutes) They occur 3 times a day.***  Current NSAIDS/analgesics:  acetaminophen (3-4 days a week for knee pain) Current antidepressant:  none Current Anticonvulsant medications:  topiramate 25mg  at bedtime, gabapentin 300mg  TID (chronic pain), pregablin 75mg  BID (chronic pain) Current Antihistamines/Decongestants:  Zyrtec  Other pain:  He has cervical spondylosis with chronic neck pain.  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  He continues to have residual pain from right carpal tunnel syndrome after release in 2021.   Sleep hygiene:  Pain often keeps him up.  He has OSA with new CPAP but no improvement.  ANA from October 2023 was negative   HISTORY: Patient has history of chronic pain.  In July 2023, he had a flare up of pain that caused him to lose his balance and he fell on his face, knocking out some of his teeth.  Since then, he has had a daily headache.  He describes a 3-4/10 left frontal non-throbbing headache with associated sharp left orbital pain.  Denies  visual disturbance, nausea, vomiting, photophobia, phonophobia.  Lasts 15-30 minutes and occurs twice a day.  Treats with acetaminophen twice a day.  Denies prior history of headaches.  Has since been evaluated by ophthalmology - no concerns.   He had a CT head on 01/19/2022 which revealed severe paranasal sinus disease and mild cerebral atrophy but no acute intracranial abnormality.  Denies congestion or rhinorrhea.    Past antidepressant:  nortriptyline (hypertension)     PAST MEDICAL HISTORY: Past Medical History:  Diagnosis Date   Carpal tunnel syndrome on right    DM (diabetes mellitus) (HCC)    EKG abnormalities    Neck pain    Syncope and collapse     MEDICATIONS: Current Outpatient Medications on File Prior to Visit  Medication Sig Dispense Refill   atorvastatin (LIPITOR) 20 MG tablet TAKE ONE TABLET BY MOUTH DAILY FOR CHOLESTEROL     Capsaicin 0.1 % CREA APPLY SMALL AMOUNT TO AFFECTED AREA AT BEDTIME AS NEEDED     escitalopram (LEXAPRO) 10 MG tablet Take 10 mg by mouth daily.  TAKE ONE-HALF TABLET BY MOUTH IN THE MORNING FOR 7 DAYS, THEN TAKE ONE TABLET IN THE MORNING FOR DEPRESSION and ANXIETY FOR DEPRESSION and ANXIETY     gabapentin (NEURONTIN) 300 MG capsule Take 1 capsule by mouth 3 (three) times daily.     Ipratropium-Albuterol (COMBIVENT) 20-100 MCG/ACT AERS respimat Inhale 1 puff into the lungs every 6 (six) hours as needed for wheezing or shortness of breath. 4 g 0   metformin (FORTAMET) 500 MG (  OSM) 24 hr tablet Take by mouth.     metFORMIN (GLUCOPHAGE) 500 MG tablet Take 1 tablet (500 mg total) by mouth 2 (two) times daily with a meal. 60 tablet 0   pregabalin (LYRICA) 150 MG capsule Take 150 mg by mouth 2 (two) times daily.     pregabalin (LYRICA) 75 MG capsule TAKE ONE CAPSULE BY MOUTH TWICE A DAY FOR CHRONIC PAIN.     Sennosides (SENNA) 8.6 MG CAPS Take 8.6 mg by mouth daily. 30 capsule 0   topiramate (TOPAMAX) 25 MG tablet Take 1 tablet (25 mg total) by mouth at  bedtime. 30 tablet 5   traMADol (ULTRAM) 50 MG tablet Take by mouth.     No current facility-administered medications on file prior to visit.    ALLERGIES: Allergies  Allergen Reactions   Duloxetine Other (See Comments)    FAMILY HISTORY: Family History  Problem Relation Age of Onset   Heart disease Father    Hypertension Father    Diabetes Brother    Lung disease Neg Hx       Objective:  *** General: No acute distress.  Patient appears well-groomed.   Head:  Normocephalic/atraumatic Neck:  Supple.  No paraspinal tenderness.  Full range of motion. Heart:  Regular rate and rhythm. Neuro:  Alert and oriented.  Speech fluent and not dysarthric.  Language intact.  CN II-XII intact.  Bulk and tone normal.  Muscle strength 5/5 throughout.  Deep tendon reflexes 2+ throughout.  Gait normal.  Romberg negative.    Joseph Millet, DO  CC: [facility]

## 2023-07-03 ENCOUNTER — Ambulatory Visit: Payer: No Typology Code available for payment source | Admitting: Neurology

## 2023-12-14 ENCOUNTER — Other Ambulatory Visit: Payer: Self-pay

## 2023-12-14 ENCOUNTER — Ambulatory Visit: Admitting: Internal Medicine

## 2023-12-14 ENCOUNTER — Ambulatory Visit (INDEPENDENT_AMBULATORY_CARE_PROVIDER_SITE_OTHER): Admitting: Internal Medicine

## 2023-12-14 ENCOUNTER — Encounter: Payer: Self-pay | Admitting: Internal Medicine

## 2023-12-14 VITALS — BP 142/80 | HR 81 | Temp 97.4°F | Ht 68.0 in | Wt 179.0 lb

## 2023-12-14 DIAGNOSIS — U071 COVID-19: Secondary | ICD-10-CM

## 2023-12-14 DIAGNOSIS — J438 Other emphysema: Secondary | ICD-10-CM | POA: Diagnosis not present

## 2023-12-14 DIAGNOSIS — J8410 Pulmonary fibrosis, unspecified: Secondary | ICD-10-CM | POA: Diagnosis not present

## 2023-12-14 LAB — PULMONARY FUNCTION TEST
DL/VA % pred: 129 %
DL/VA: 5.36 ml/min/mmHg/L
DLCO unc % pred: 74 %
DLCO unc: 18.64 ml/min/mmHg
FEF 25-75 Post: 1.75 L/s
FEF 25-75 Pre: 3.53 L/s
FEF2575-%Change-Post: -50 %
FEF2575-%Pred-Post: 70 %
FEF2575-%Pred-Pre: 141 %
FEV1-%Change-Post: -8 %
FEV1-%Pred-Post: 62 %
FEV1-%Pred-Pre: 68 %
FEV1-Post: 1.97 L
FEV1-Pre: 2.16 L
FEV1FVC-%Change-Post: -4 %
FEV1FVC-%Pred-Pre: 116 %
FEV6-%Change-Post: -4 %
FEV6-%Pred-Post: 59 %
FEV6-%Pred-Pre: 62 %
FEV6-Post: 2.39 L
FEV6-Pre: 2.49 L
FEV6FVC-%Pred-Post: 105 %
FEV6FVC-%Pred-Pre: 105 %
FVC-%Change-Post: -3 %
FVC-%Pred-Post: 56 %
FVC-%Pred-Pre: 58 %
FVC-Post: 2.39 L
FVC-Pre: 2.49 L
Post FEV1/FVC ratio: 83 %
Post FEV6/FVC ratio: 100 %
Pre FEV1/FVC ratio: 87 %
Pre FEV6/FVC Ratio: 100 %
RV % pred: 59 %
RV: 1.33 L
TLC % pred: 58 %
TLC: 3.85 L

## 2023-12-14 NOTE — Patient Instructions (Signed)
 Full pft performed today.

## 2023-12-14 NOTE — Patient Instructions (Signed)
 It was a pleasure to see you today!  Please schedule follow up as needed. Please call [phone] if issues or concerns arise. You can also send us  a message through MyChart, but but aware that this is not to be used for urgent issues and it may take up to 5-7 days to receive a reply. Please be aware that you will likely be able to view your results before I have a chance to respond to them. Please give us  5 business days to respond to any non-urgent results.    PFT  from 2022 - 2025 is stable and unchanged Over three years of documented stability and no change in lung function. Your physical exam is reassuring At this point I think we can move to following up as needed if there is a change in symptoms.

## 2023-12-14 NOTE — Progress Notes (Signed)
 Full pft performed today.

## 2023-12-14 NOTE — Progress Notes (Signed)
 Joseph Todd    [id number]    05/10/1955  Primary Care Physician:[facility] Date of Appointment: 12/14/2023 Established Patient Visit  Chief complaint:   Chief Complaint  Patient presents with   Follow-up    Pft    Shortness of Breath    Walking, riding a bike and going upstairs.     HPI: Joseph Todd is a 68 y.o. gentleman with COVID 19 infection 2021 with resultant mild pulmonary fibrosis.  Interval Updates: Here for annual follow up.  No interval hospitalizations for breathing. No cough, wheezing, chest tightness.  Had PFTS done which show stable lung function, FVC unchanged since 2022.  Has occasional dyspnea on exertion but does all ADLs, keeps his grandchidlren over the summer.   I have reviewed the patient's family social and past medical history and updated as appropriate.   Past Medical History:  Diagnosis Date   Carpal tunnel syndrome on right    DM (diabetes mellitus) (HCC)    EKG abnormalities    Neck pain    Syncope and collapse     Past Surgical History:  Procedure Laterality Date   CARPAL TUNNEL RELEASE Right     Family History  Problem Relation Age of Onset   Heart disease Father    Hypertension Father    Diabetes Brother    Lung disease Neg Hx     Social History   Occupational History   Not on file  Tobacco Use   Smoking status: Former    Current packs/day: 0.00    Types: Cigarettes    Quit date: 1990    Years since quitting: 35.6   Smokeless tobacco: Never   Tobacco comments:    at the most 4 a day.  Vaping Use   Vaping status: Never Used  Substance and Sexual Activity   Alcohol use: Yes    Alcohol/week: 3.0 standard drinks of alcohol    Types: 3 Cans of beer per week    Comment: 2-3 times a week   Drug use: Never   Sexual activity: Not on file     Physical Exam: Blood pressure (!) 142/80, pulse 81, temperature (!) 97.4 F (36.3 C), temperature source Oral, height 5' 8 (1.727 m), weight 179 lb (81.2  kg), SpO2 96%.  Gen:      Well developed, well appearing NAD Lungs:    ctab no wheezes or crackles CV:       RRR no mrg, no edema   Data Reviewed: Imaging: I have personally reviewed the CT Chest from May 2022 and compared it to Feb 2022. There is dramatic improvement in multifocal airspace opacities. The CT Chest in May has some fine fibrotic changes in both bases as well as some paraseptal emphysema  PFTs:     Latest Ref Rng & Units 12/14/2023    8:39 AM 10/08/2020   11:58 AM  PFT Results  FVC-Pre L 2.49  P 2.39   FVC-Predicted Pre % 58  P 65   FVC-Post L 2.39  P 2.31   FVC-Predicted Post % 56  P 63   Pre FEV1/FVC % % 87  P 89   Post FEV1/FCV % % 83  P 90   FEV1-Pre L 2.16  P 2.13   FEV1-Predicted Pre % 68  P 75   FEV1-Post L 1.97  P 2.08   DLCO uncorrected ml/min/mmHg 18.64  P 18.50   DLCO UNC% % 74  P 72  DLCO corrected ml/min/mmHg  18.50   DLCO COR %Predicted %  72   DLVA Predicted % 129  P 128   TLC L 3.85  P 3.71   TLC % Predicted % 58  P 56   RV % Predicted % 59  P 51     P Preliminary result   12/2023 Mild restriction to ventilation, reduced dlco  FVC stable from 2022 to 2025.   Labs: Lab Results  Component Value Date   NA 137 01/11/2023   K 4.2 01/11/2023   CO2 28 01/11/2023   GLUCOSE 240 (H) 01/11/2023   BUN 14 01/11/2023   CREATININE 1.31 (H) 01/11/2023   CALCIUM  9.7 01/11/2023   GFRNONAA >60 01/11/2023   Lab Results  Component Value Date   WBC 8.9 01/11/2023   HGB 15.6 01/11/2023   HCT 46.7 01/11/2023   MCV 91.6 01/11/2023   PLT 268 01/11/2023    Immunization status: Immunization History  Administered Date(s) Administered   Influenza Split 01/13/2016, 02/06/2017   Influenza,inj,Quad PF,6+ Mos 08/14/2020, 02/12/2021   Influenza-Unspecified 06/05/2013   Moderna Covid-19 Fall Seasonal Vaccine 1yrs & older 02/17/2022   Moderna Covid-19 Vaccine Bivalent Booster 60yrs & up 02/19/2021   Moderna Sars-Covid-2 Vaccination 09/18/2019, 10/08/2019,  04/23/2020   Pneumococcal-Unspecified 05/09/2005   Tdap 11/17/2012, 01/11/2023    Assessment:  COVID 19 infection Jan 2022 Pulmonary Fibrosis following Covid, mild restriction to ventilation Mild Emphysema History of tobacco use  Plan/Recommendations: Spirometry from 2022 - 2025 is stable and unchanged Over three years of documented stability and no change in lung function. Will proceed with annual PFT - will schedule today No crackles on exam, SpO2 is wnl.  At this point I think we can move to following up as needed if there is a change in symptoms.  Emphysema stable not on inhaler therapy.  Outside the window for lung cancer screening having quit in 1990.   Return to Care: No follow-ups on file.   Verdon Gore, MD Pulmonary and Critical Care Medicine [facility] Office:[phone]
